# Patient Record
Sex: Male | Born: 1965 | Race: White | Hispanic: No | Marital: Married | Smoking: Never smoker
Health system: Southern US, Community
[De-identification: ages and names within clinical notes are randomized; demographics above are authoritative.]

## PROBLEM LIST (undated history)

## (undated) DIAGNOSIS — T7840XA Allergy, unspecified, initial encounter: Secondary | ICD-10-CM

## (undated) DIAGNOSIS — D126 Benign neoplasm of colon, unspecified: Secondary | ICD-10-CM

## (undated) DIAGNOSIS — H3530 Unspecified macular degeneration: Secondary | ICD-10-CM

## (undated) DIAGNOSIS — R51 Headache: Secondary | ICD-10-CM

## (undated) DIAGNOSIS — M1990 Unspecified osteoarthritis, unspecified site: Secondary | ICD-10-CM

## (undated) HISTORY — DX: Unspecified osteoarthritis, unspecified site: M19.90

## (undated) HISTORY — DX: Allergy, unspecified, initial encounter: T78.40XA

## (undated) HISTORY — DX: Unspecified macular degeneration: H35.30

## (undated) HISTORY — DX: Benign neoplasm of colon, unspecified: D12.6

## (undated) HISTORY — PX: PARTIAL KNEE ARTHROPLASTY: SHX2174

## (undated) HISTORY — PX: COLONOSCOPY: SHX174

## (undated) HISTORY — PX: POLYPECTOMY: SHX149

## (undated) HISTORY — DX: Headache: R51

---
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
## 1990-09-05 HISTORY — PX: OTHER SURGICAL HISTORY: SHX169

## 1998-07-21 ENCOUNTER — Emergency Department (HOSPITAL_COMMUNITY): Admission: EM | Admit: 1998-07-21 | Discharge: 1998-07-21 | Payer: Self-pay | Admitting: Emergency Medicine

## 2006-05-21 ENCOUNTER — Emergency Department (HOSPITAL_COMMUNITY): Admission: EM | Admit: 2006-05-21 | Discharge: 2006-05-21 | Payer: Self-pay | Admitting: Emergency Medicine

## 2010-01-25 ENCOUNTER — Ambulatory Visit: Payer: Self-pay | Admitting: Family Medicine

## 2010-01-25 DIAGNOSIS — K4090 Unilateral inguinal hernia, without obstruction or gangrene, not specified as recurrent: Secondary | ICD-10-CM | POA: Insufficient documentation

## 2010-01-25 DIAGNOSIS — M1990 Unspecified osteoarthritis, unspecified site: Secondary | ICD-10-CM | POA: Insufficient documentation

## 2010-01-25 DIAGNOSIS — Z87898 Personal history of other specified conditions: Secondary | ICD-10-CM

## 2010-01-25 DIAGNOSIS — N529 Male erectile dysfunction, unspecified: Secondary | ICD-10-CM | POA: Insufficient documentation

## 2010-01-29 ENCOUNTER — Ambulatory Visit: Payer: Self-pay | Admitting: Family Medicine

## 2010-01-29 LAB — CONVERTED CEMR LAB
ALT: 27 units/L (ref 0–53)
AST: 38 units/L — ABNORMAL HIGH (ref 0–37)
BUN: 18 mg/dL (ref 6–23)
Bilirubin Urine: NEGATIVE
Bilirubin, Direct: 0.2 mg/dL (ref 0.0–0.3)
Cholesterol: 174 mg/dL (ref 0–200)
Creatinine, Ser: 1.1 mg/dL (ref 0.4–1.5)
Eosinophils Relative: 2.5 % (ref 0.0–5.0)
GFR calc non Af Amer: 74.13 mL/min (ref >60)
HCT: 43.5 % (ref 39.0–52.0)
LDL Cholesterol: 108 mg/dL — ABNORMAL HIGH (ref 0–99)
Monocytes Relative: 7.4 % (ref 3.0–12.0)
Neutrophils Relative %: 67.6 % (ref 43.0–77.0)
Nitrite: NEGATIVE
Platelets: 204 10*3/uL (ref 150.0–400.0)
Potassium: 4.2 meq/L (ref 3.5–5.1)
RBC: 4.93 M/uL (ref 4.22–5.81)
Total Bilirubin: 1 mg/dL (ref 0.3–1.2)
Total CHOL/HDL Ratio: 4
Urobilinogen, UA: 0.2
VLDL: 27 mg/dL (ref 0.0–40.0)
WBC: 6.3 10*3/uL (ref 4.5–10.5)

## 2010-02-08 ENCOUNTER — Ambulatory Visit: Payer: Self-pay | Admitting: Family Medicine

## 2010-09-05 HISTORY — PX: KNEE ARTHROSCOPY: SUR90

## 2010-10-07 NOTE — Assessment & Plan Note (Signed)
Summary: cpx/njr   Vital Signs:  Patient profile:   45 year old male Height:      225 inches Weight:      70.50 pounds Temp:     98.2 degrees F oral Pulse rate:   72 / minute Pulse rhythm:   regular Resp:     12 per minute BP sitting:   110 / 72  (left arm) Cuff size:   regular  Vitals Entered By: Sid Falcon LPN (February 09, 4539 10:42 AM) CC: CPX   History of Present Illness: Here for CPE.  No signif chronic medical problems. Ongoing knee problems and is scheduled to see orthopedist. No regular exercise.  PMH, SH, AND FH reviewed.  Tetanus up to date.  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    Physical Exam  General:  Well-developed,well-nourished,in no acute distress; alert,appropriate and cooperative throughout examination Head:  Normocephalic and atraumatic without obvious  abnormalities. No apparent alopecia or balding. Eyes:  No corneal or conjunctival inflammation noted. EOMI. Perrla. Funduscopic exam benign, without hemorrhages, exudates or papilledema. Vision grossly normal. Ears:  External ear exam shows no significant lesions or deformities.  Otoscopic examination reveals clear canals, tympanic membranes are intact bilaterally without bulging, retraction, inflammation or discharge. Hearing is grossly normal bilaterally. Mouth:  Oral mucosa and oropharynx without lesions or exudates.  Teeth in good repair. Neck:  No deformities, masses, or tenderness noted. Lungs:  Normal respiratory effort, chest expands symmetrically. Lungs are clear to auscultation, no crackles or wheezes. Heart:  Normal rate and regular rhythm. S1 and S2 normal without gallop, murmur, click, rub or other extra sounds. Abdomen:  Bowel sounds positive,abdomen soft and non-tender without masses, organomegaly or hernias noted. Rectal:  No external abnormalities noted. Normal sphincter tone. No rectal masses or tenderness. Genitalia:  Testes bilaterally descended without nodularity, tenderness or masses. No scrotal masses or lesions. No penis lesions or urethral discharge. Prostate:  Prostate gland firm and smooth, no enlargement, nodularity, tenderness, mass, asymmetry or induration. Msk:  No deformity or scoliosis noted of thoracic or lumbar spine.   Extremities:  No clubbing, cyanosis, edema, or deformity noted with normal full range of motion of all joints.   Neurologic:  No cranial nerve deficits noted. Station and gait are normal. Plantar reflexes are down-going bilaterally. DTRs are symmetrical throughout. Sensory, motor and coordinative functions appear intact. Skin:  multiple nevi but benign features. Cervical Nodes:  No lymphadenopathy noted Psych:  normally interactive, good eye contact, not anxious appearing, and not depressed appearing.     Impression & Recommendations:  Problem #  1:  ROUTINE GENERAL MEDICAL [email or web address]@[email or web address]  CARE FACL (ICD-V70.0) labs reviewed with pt.  Establish more regular exercise.    Complete Medication List: 1)  Cialis 20 Mg Tabs (Tadalafil) .... One by mouth every other day as needed  Patient Instructions: 1)  It is important that you exercise reguarly at least 20 minutes 5 times a week. If you develop chest pain, have severe difficulty breathing, or feel very tired, stop exercising immediately and seek medical attention.  2)  Please schedule a follow-up appointment in 1 year.

## 2010-10-07 NOTE — Assessment & Plan Note (Signed)
Summary: to be est at brass/?hernia/njr   Vital Signs:  Patient profile:   45 year old male Height:      224 inches Weight:      70.50 pounds BMI:     0.99 Temp:     98.4 degrees F oral Pulse rate:   72 / minute Pulse rhythm:   regular Resp:     12 per minute BP sitting:   100 / 62  (left arm) Cuff size:   regular  Vitals Entered By: Sid Falcon LPN (Jan 25, 2010 11:15 AM) CC: New to establish, right lower extremity discomfort   History of Present Illness: Patient new to establish care. Past medical history of migraine headaches. Usually over-the-counter medications relieve these. No progressive pattern.  Intermittent right scrotal swelling. Nonpainful. Possibly worse with straining. No known history of hernia.  History of chronic right knee pain greater than left. Known osteoarthritis. Previously seen orthopedist. Pain with squatting. Intermittent swelling. No locking or giving way.  History erectile dysfunction. Prescription for Cialis but never filled. Nonsmoker. No signif risk factors for peripheral vascular disease.  Family history and social history are as recorded  Preventive Screening-Counseling & Management  Alcohol-Tobacco     Smoking Status: never  Allergies (verified): 1)  Penicillin V Potassium (Penicillin V Potassium)  Past History:  Family History: Last updated: 01/25/2010 Family History of Arthritis Family History Hypertension, mother  Social History: Last updated: 01/25/2010 Occupation: Married Never Smoked Alcohol use-yes  Risk Factors: Smoking Status: never (01/25/2010)  Past Medical History: Migraines Osteoarthritis  Past Surgical History: Tonsillectomy Bone graft 1992 L hand  Family History: Family History of Arthritis Family History Hypertension, mother  Social History: Occupation: Married Never Smoked Alcohol use-yes Occupation:  employed Smoking Status:  never  Review of Systems  The patient denies anorexia,  fever, weight loss, weight gain, chest pain, syncope, dyspnea on exertion, peripheral edema, prolonged cough, hemoptysis, abdominal pain, melena, hematochezia, severe indigestion/heartburn, muscle weakness, and suspicious skin lesions.    Physical Exam  General:  Well-developed,well-nourished,in no acute distress; alert,appropriate and cooperative throughout examination Ears:  External ear exam shows no significant lesions or deformities.  Otoscopic examination reveals clear canals, tympanic membranes are intact bilaterally without bulging, retraction, inflammation or discharge. Hearing is grossly normal bilaterally. Mouth:  Oral mucosa and oropharynx without lesions or exudates.  Teeth in good repair. Neck:  No deformities, masses, or tenderness noted. Lungs:  Normal respiratory effort, chest expands symmetrically. Lungs are clear to auscultation, no crackles or wheezes. Heart:  Normal rate and regular rhythm. S1 and S2 normal without gallop, murmur, click, rub or other extra sounds. Genitalia:  ?small R inguinal hernia nontender.  No testicular mass.  Extremities:  No clubbing, cyanosis, edema, or deformity noted with normal full range of motion of all joints.   Psych:  normally interactive, not anxious appearing, and not depressed appearing.     Impression & Recommendations:  Problem # 1:  OSTEOARTHRITIS (ICD-715.90) mostly knees.  Problem # 2:  IMPOTENCE OF ORGANIC ORIGIN (ICD-607.84) rx for Cialis. His updated medication list for this problem includes:    Cialis 20 Mg Tabs (Tadalafil) ..... One by mouth every other day as needed  Problem # 3:  INGUINAL HERNIA (ICD-550.90) Assessment: New pt scheduling CPE and reassess then.  Problem # 4:  MIGRAINES, HX OF (ICD-V13.8)  Complete Medication List: 1)  Cialis 20 Mg Tabs (Tadalafil) .... One by mouth every other day as needed  Patient Instructions: 1)  schedule complete physical  examination Prescriptions: CIALIS 20 MG TABS  (TADALAFIL) one by mouth every other day as needed  #6 x 11   Entered and Authorized by:   Evelena Peat MD   Signed by:   Evelena Peat MD on 01/25/2010   Method used:   Print then Give to Patient   RxID:   [id number]   Preventive Care Screening  Last Tetanus Booster:    Date:  11/04/2007    Results:  Historical

## 2010-10-24 ENCOUNTER — Ambulatory Visit (INDEPENDENT_AMBULATORY_CARE_PROVIDER_SITE_OTHER): Payer: BC Managed Care – PPO

## 2010-10-24 ENCOUNTER — Emergency Department (HOSPITAL_COMMUNITY)
Admission: EM | Admit: 2010-10-24 | Discharge: 2010-10-24 | Disposition: A | Discharge status: Home / Self Care | Payer: BC Managed Care – PPO | Attending: Emergency Medicine | Admitting: Emergency Medicine

## 2010-10-24 ENCOUNTER — Inpatient Hospital Stay (INDEPENDENT_AMBULATORY_CARE_PROVIDER_SITE_OTHER)
Admission: RE | Admit: 2010-10-24 | Discharge: 2010-10-24 | Disposition: A | Discharge status: Home / Self Care | Payer: BC Managed Care – PPO | Source: Ambulatory Visit | Attending: Emergency Medicine | Admitting: Emergency Medicine

## 2010-10-24 DIAGNOSIS — R112 Nausea with vomiting, unspecified: Secondary | ICD-10-CM | POA: Insufficient documentation

## 2010-10-24 DIAGNOSIS — R109 Unspecified abdominal pain: Secondary | ICD-10-CM | POA: Insufficient documentation

## 2010-10-24 DIAGNOSIS — K5289 Other specified noninfective gastroenteritis and colitis: Secondary | ICD-10-CM | POA: Insufficient documentation

## 2010-10-24 DIAGNOSIS — R51 Headache: Secondary | ICD-10-CM | POA: Insufficient documentation

## 2010-10-24 DIAGNOSIS — R197 Diarrhea, unspecified: Secondary | ICD-10-CM | POA: Insufficient documentation

## 2010-10-24 DIAGNOSIS — K921 Melena: Secondary | ICD-10-CM | POA: Insufficient documentation

## 2010-10-24 LAB — POCT I-STAT, CHEM 8
BUN: 24 mg/dL — ABNORMAL HIGH (ref 6–23)
Calcium, Ion: 1.09 mmol/L — ABNORMAL LOW (ref 1.12–1.32)
Creatinine, Ser: 1.3 mg/dL (ref 0.4–1.5)
Glucose, Bld: 121 mg/dL — ABNORMAL HIGH (ref 70–99)
TCO2: 23 mmol/L (ref 0–100)

## 2010-10-24 LAB — CBC
Hemoglobin: 17.6 g/dL — ABNORMAL HIGH (ref 13.0–17.0)
MCH: 30.6 pg (ref 26.0–34.0)
MCHC: 35.6 g/dL (ref 30.0–36.0)
Platelets: 189 10*3/uL (ref 150–400)

## 2010-10-24 LAB — DIFFERENTIAL
Basophils Relative: 0 % (ref 0–1)
Eosinophils Absolute: 0 10*3/uL (ref 0.0–0.7)
Monocytes Absolute: 0.5 10*3/uL (ref 0.1–1.0)
Monocytes Relative: 3 % (ref 3–12)

## 2010-10-24 LAB — URINALYSIS, ROUTINE W REFLEX MICROSCOPIC
Ketones, ur: NEGATIVE mg/dL
Leukocytes, UA: NEGATIVE
Protein, ur: NEGATIVE mg/dL
Urine Glucose, Fasting: 250 mg/dL — AB

## 2010-10-25 ENCOUNTER — Ambulatory Visit (INDEPENDENT_AMBULATORY_CARE_PROVIDER_SITE_OTHER): Payer: BC Managed Care – PPO | Admitting: Family Medicine

## 2010-10-25 ENCOUNTER — Encounter: Payer: Self-pay | Admitting: Family Medicine

## 2010-10-25 VITALS — BP 102/68 | Temp 98.1°F | Ht 71.0 in | Wt 232.0 lb

## 2010-10-25 DIAGNOSIS — K529 Noninfective gastroenteritis and colitis, unspecified: Secondary | ICD-10-CM

## 2010-10-25 DIAGNOSIS — K5289 Other specified noninfective gastroenteritis and colitis: Secondary | ICD-10-CM

## 2010-10-25 NOTE — Progress Notes (Signed)
  Subjective:    Patient ID: Martin Bennett, male    DOB: December 19, 1965, 45 y.o.   MRN: [id number]  HPI  Patient seen for ER followup. Presented there yesterday with vomiting and diarrhea. Symptoms started around 5 AM. Presented to the emergency department around 6 PM. Nonbloody diarrhea and no hematemesis. Patient was given IV fluids and Zofran which helped control nausea. Feels much better today with no further diarrhea or vomiting. No fever. Crampy abdominal pain yesterday which has resolved. One daughter and wife with similar symptoms. Has kept down several fluids today.   Review of Systems  poor appetite. No fever. No abdominal pain today.    Objective:   Physical Exam     Patient is alert nontoxic in appearance Oropharynx is moist and clear Neck is supple with no adenopathy Chest clear to auscultation Heart regular rhythm and rate Abdomen is nondistended , normal bowel sounds, soft and nontender with no mass    Assessment & Plan:   viral gastroenteritis -resolving. Reviewed bland diet. Work note written for today

## 2010-12-31 ENCOUNTER — Ambulatory Visit (INDEPENDENT_AMBULATORY_CARE_PROVIDER_SITE_OTHER): Payer: BC Managed Care – PPO | Admitting: Family Medicine

## 2010-12-31 ENCOUNTER — Encounter: Payer: Self-pay | Admitting: Family Medicine

## 2010-12-31 VITALS — BP 110/68 | Temp 98.0°F | Wt 227.0 lb

## 2010-12-31 DIAGNOSIS — K921 Melena: Secondary | ICD-10-CM

## 2010-12-31 NOTE — Patient Instructions (Signed)
Avoid aspirin. Continue plenty of fluids and high fiber diet. Stool softeners as needed to avoid constipation

## 2010-12-31 NOTE — Progress Notes (Signed)
  Subjective:    Patient ID: Martin Bennett, male    DOB: 12/15/1965, 45 y.o.   MRN: [id number]  HPI Patient seen with approximately one-week history of intermittent bright red blood per rectum. History of similar symptoms in the past. Previous diagnosis internal hemorrhoids. Denies recent diarrhea or constipation. No fever. No abdominal pain. Does not take any aspirin or other anticoagulants. No recent appetite or weight changes.  Symptoms do not occur consistently with all stools.  No perianal pain.  Past Medical History  Diagnosis Date  . Headache   . Arthritis     osetoarthritis    Past Surgical History  Procedure Date  . Tonsillectomy   . Bone graft l hand 1992    reports that he has never smoked. He does not have any smokeless tobacco history on file. He reports that he drinks alcohol. He reports that he does not use illicit drugs. family history includes Arthritis in his other and Hypertension in his mother. Allergies  Allergen Reactions  . Penicillins       Review of Systems  Constitutional: Negative for fever, activity change, appetite change and unexpected weight change.  Respiratory: Negative for shortness of breath.   Cardiovascular: Negative for chest pain.  Gastrointestinal: Positive for blood in stool. Negative for nausea, vomiting, abdominal pain, diarrhea, constipation and abdominal distention.       Objective:   Physical Exam  Constitutional: He appears well-developed and well-nourished.  Cardiovascular: Normal rate, regular rhythm and normal heart sounds.   No murmur heard. Pulmonary/Chest: Effort normal and breath sounds normal. No respiratory distress. He has no wheezes. He has no rales.  Abdominal: Soft. Bowel sounds are normal. He exhibits no distension. There is no tenderness. There is no rebound and no guarding.  Genitourinary: Rectum normal.       No rectal mass. Minimal stool in rectal vault. Hemoccult negative Endoscopy reveals that he does have  some internal hemorrhoids but no active bleeding at this time.   Did not appreciate any anal fissures          Assessment & Plan:  Hematochezia. He does have history of internal hemorrhoids and has some visible internal hemorrhoids but no active bleeding at this time. Given the fact that we cannot localize active source of bleeding and reported bright red blood this morning recommend further evaluation. Check CBC. Refer to gastroenterologist

## 2011-01-01 LAB — CBC WITH DIFFERENTIAL/PLATELET
Eosinophils Absolute: 0.4 10*3/uL (ref 0.0–0.7)
Eosinophils Relative: 5 % (ref 0–5)
Hemoglobin: 14.7 g/dL (ref 13.0–17.0)
Lymphs Abs: 1.8 10*3/uL (ref 0.7–4.0)
MCH: 29.5 pg (ref 26.0–34.0)
MCV: 85.3 fL (ref 78.0–100.0)
Monocytes Absolute: 0.8 10*3/uL (ref 0.1–1.0)
Monocytes Relative: 10 % (ref 3–12)
Platelets: 231 10*3/uL (ref 150–400)
RBC: 4.98 MIL/uL (ref 4.22–5.81)

## 2011-01-06 NOTE — Progress Notes (Signed)
Quick Note:  Pt informed on personally idenfied VM ______

## 2011-02-03 ENCOUNTER — Other Ambulatory Visit (INDEPENDENT_AMBULATORY_CARE_PROVIDER_SITE_OTHER): Payer: BC Managed Care – PPO

## 2011-02-03 DIAGNOSIS — Z0000 Encounter for general adult medical examination without abnormal findings: Secondary | ICD-10-CM

## 2011-02-03 LAB — LIPID PANEL
HDL: 43.9 mg/dL (ref >39.00)
Total CHOL/HDL Ratio: 3
Triglycerides: 103 mg/dL (ref 0.0–149.0)
VLDL: 20.6 mg/dL (ref 0.0–40.0)

## 2011-02-03 LAB — BASIC METABOLIC PANEL
BUN: 20 mg/dL (ref 6–23)
CO2: 28 mEq/L (ref 19–32)
Calcium: 8.9 mg/dL (ref 8.4–10.5)
Creatinine, Ser: 1.1 mg/dL (ref 0.4–1.5)
Glucose, Bld: 79 mg/dL (ref 70–99)

## 2011-02-03 LAB — CBC WITH DIFFERENTIAL/PLATELET
Basophils Absolute: 0 10*3/uL (ref 0.0–0.1)
Eosinophils Absolute: 0.2 10*3/uL (ref 0.0–0.7)
Lymphocytes Relative: 23.1 % (ref 12.0–46.0)
MCHC: 34.1 g/dL (ref 30.0–36.0)
Monocytes Absolute: 0.5 10*3/uL (ref 0.1–1.0)
Neutrophils Relative %: 64.8 % (ref 43.0–77.0)
RDW: 13.3 % (ref 11.5–14.6)

## 2011-02-03 LAB — POCT URINALYSIS DIPSTICK
Blood, UA: NEGATIVE
Ketones, UA: NEGATIVE
Leukocytes, UA: NEGATIVE
Protein, UA: NEGATIVE
pH, UA: 6

## 2011-02-03 LAB — HEPATIC FUNCTION PANEL
Albumin: 4 g/dL (ref 3.5–5.2)
Alkaline Phosphatase: 68 U/L (ref 39–117)
Bilirubin, Direct: 0.1 mg/dL (ref 0.0–0.3)

## 2011-02-10 ENCOUNTER — Encounter: Payer: Self-pay | Admitting: Family Medicine

## 2011-02-10 ENCOUNTER — Ambulatory Visit (INDEPENDENT_AMBULATORY_CARE_PROVIDER_SITE_OTHER): Payer: BC Managed Care – PPO | Admitting: Family Medicine

## 2011-02-10 VITALS — BP 110/70 | Temp 98.2°F | Ht 71.0 in | Wt 228.0 lb

## 2011-02-10 DIAGNOSIS — Z0000 Encounter for general adult medical examination without abnormal findings: Secondary | ICD-10-CM

## 2011-02-10 MED ORDER — TADALAFIL 20 MG PO TABS: ORAL_TABLET | ORAL | Status: DC

## 2011-02-10 NOTE — Progress Notes (Signed)
  Subjective:    Patient ID: Martin Bennett, male    DOB: Jul 17, 1966, 45 y.o.   MRN: [id number]  HPI Patient here for complete physical examination. Recent bright red blood per rectum is fully clear. He has history of erectile dysfunction and takes Cialis but no other prescription medications. Tetanus 2009. Patient had recent left knee arthroscopic surgery with great improvement in pain. More active since then. He is exercising more. No history of smoking.  Family history is reviewed no significant changes   Review of Systems  Constitutional: Negative for fever, activity change, appetite change and fatigue.  HENT: Negative for ear pain, congestion and trouble swallowing.   Eyes: Negative for pain and visual disturbance.  Respiratory: Negative for cough, shortness of breath and wheezing.   Cardiovascular: Negative for chest pain and palpitations.  Gastrointestinal: Negative for nausea, vomiting, abdominal pain, diarrhea, constipation, blood in stool, abdominal distention and rectal pain.  Genitourinary: Negative for dysuria, hematuria and testicular pain.  Musculoskeletal: Negative for joint swelling and arthralgias.  Skin: Negative for rash.  Neurological: Negative for dizziness, syncope and headaches.  Hematological: Negative for adenopathy.  Psychiatric/Behavioral: Negative for confusion and dysphoric mood.       Objective:   Physical Exam  Constitutional: He is oriented to person, place, and time. He appears well-developed and well-nourished. No distress.  HENT:  Head: Normocephalic and atraumatic.  Right Ear: External ear normal.  Left Ear: External ear normal.  Mouth/Throat: Oropharynx is clear and moist.  Eyes: Conjunctivae and EOM are normal. Pupils are equal, round, and reactive to light.  Neck: Normal range of motion. Neck supple. No thyromegaly present.  Cardiovascular: Normal rate, regular rhythm and normal heart sounds.   No murmur heard. Pulmonary/Chest: No  respiratory distress. He has no wheezes. He has no rales.  Abdominal: Soft. Bowel sounds are normal. He exhibits no distension and no mass. There is no tenderness. There is no rebound and no guarding.  Genitourinary:       Rectal exam done just couple months ago so deferred today  Musculoskeletal: He exhibits no edema.  Lymphadenopathy:    He has no cervical adenopathy.  Neurological: He is alert and oriented to person, place, and time. He displays normal reflexes. No cranial nerve deficit.  Skin: No rash noted.       Patient has multiple scattered nevi on trunk but none with significant atypical features  Psychiatric: He has a normal mood and affect.          Assessment & Plan:  Complete physical examination. Labs reviewed with patient and all favorable. Tetanus up to date. Continue regular exercise. Followup promptly for any recurrent bloody stools

## 2011-02-10 NOTE — Patient Instructions (Signed)
Continue regular exercise.  Notify me if you have any recurrent bloody stools.

## 2011-09-06 HISTORY — PX: KNEE ARTHROSCOPY: SUR90

## 2011-10-17 ENCOUNTER — Telehealth: Payer: Self-pay | Admitting: *Deleted

## 2011-10-17 NOTE — Telephone Encounter (Signed)
Should be seen tomorrow and sooner (ED) if any dizziness or abdominal pain or profuse bleeding.

## 2011-10-17 NOTE — Telephone Encounter (Signed)
Pt has had 3 episodes of bleeding with BMs and today spontaneous bleeding without BM.  Saturated his undershorts.  What are Dr. Lucie Leather recommendations.?

## 2011-10-18 ENCOUNTER — Encounter: Payer: Self-pay | Admitting: Family Medicine

## 2011-10-18 ENCOUNTER — Ambulatory Visit (INDEPENDENT_AMBULATORY_CARE_PROVIDER_SITE_OTHER): Payer: BC Managed Care – PPO | Admitting: Family Medicine

## 2011-10-18 VITALS — BP 100/50 | Temp 98.3°F | Wt 231.0 lb

## 2011-10-18 DIAGNOSIS — K602 Anal fissure, unspecified: Secondary | ICD-10-CM

## 2011-10-18 DIAGNOSIS — K921 Melena: Secondary | ICD-10-CM

## 2011-10-18 NOTE — Patient Instructions (Signed)
Anal Fissure, Adult An anal fissure is a small tear or crack in the skin around the anus. Bleeding from a fissure usually stops on its own within a few minutes. However, bleeding will often reoccur with each bowel movement until the crack heals.  CAUSES   Passing large, hard stools.   Frequent diarrheal stools.   Constipation.   Inflammatory bowel disease (Crohn's disease or ulcerative colitis).   Infections.   Anal sex.  SYMPTOMS   Small amounts of blood seen on your stools, on toilet paper, or in the toilet after a bowel movement.   Rectal bleeding.   Painful bowel movements.   Itching or irritation around the anus.  DIAGNOSIS Your caregiver will examine the anal area. An anal fissure can usually be seen with careful inspection. A rectal exam may be performed and a short tube (anoscope) may be used to examine the anal canal. TREATMENT   You may be instructed to take fiber supplements. These supplements can soften your stool to help make bowel movements easier.   Sitz baths may be recommended to help heal the tear. Do not use soap in the sitz baths.   A medicated cream or ointment may be prescribed to lessen discomfort.  HOME CARE INSTRUCTIONS   Maintain a diet high in fruits, whole grains, and vegetables. Avoid constipating foods like bananas and dairy products.   Take sitz baths as directed by your caregiver.   Drink enough fluids to keep your urine clear or pale yellow.   Only take over-the-counter or prescription medicines for pain, discomfort, or fever as directed by your caregiver. Do not take aspirin as this may increase bleeding.   Do not use ointments containing numbing medications (anesthetics) or hydrocortisone. They could slow healing.  SEEK MEDICAL CARE IF:   Your fissure is not completely healed within 3 days.   You have further bleeding.   You have a fever.   You have diarrhea mixed with blood.   You have pain.   Your problem is getting worse  rather than better.  MAKE SURE YOU:   Understand these instructions.   Will watch your condition.   Will get help right away if you are not doing well or get worse.  Document Released: 08/22/2005 Document Revised: 05/04/2011 Document Reviewed: 02/06/2011 [employer] Patient Information 2012 [employer]. 

## 2011-10-18 NOTE — Progress Notes (Signed)
  Subjective:    Patient ID: Martin Bennett, male    DOB: Jan 31, 1966, 46 y.o.   MRN: [id number]  HPI  Acute visit. Bright red blood per rectum. 3-4 episodes this week. No pain with stools. No significant constipation. Had similar episodes remotely in the past attributed to internal hemorrhoids. He denied any dizziness. No family history of colorectal problems. No recent diarrhea. Denies abdominal pain. No appetite or weight changes. No fever or chills.  Similar episode last year about this time. We could not find clear source then. CBC normal. We referred for gastroenterology evaluation but patient never went.  Past Medical History  Diagnosis Date  . Headache   . Arthritis     osetoarthritis    Past Surgical History  Procedure Date  . Tonsillectomy   . Bone graft l hand 1992  . Knee arthroscopy 2012    L knee    reports that he has never smoked. He does not have any smokeless tobacco history on file. He reports that he drinks alcohol. He reports that he does not use illicit drugs. family history includes Arthritis in his other and Hypertension in his mother. Allergies  Allergen Reactions  . Penicillins       Review of Systems  Constitutional: Negative for fever, chills, appetite change and unexpected weight change.  Respiratory: Negative for shortness of breath.   Cardiovascular: Negative for chest pain.  Gastrointestinal: Positive for blood in stool. Negative for nausea, vomiting, abdominal pain, diarrhea, constipation and abdominal distention.  Neurological: Negative for dizziness.       Objective:   Physical Exam  Constitutional: He appears well-developed and well-nourished.  Cardiovascular: Normal rate and regular rhythm.   Pulmonary/Chest: Effort normal and breath sounds normal. No respiratory distress. He has no wheezes. He has no rales.  Abdominal: Soft. Bowel sounds are normal. He exhibits no distension and no mass. There is no tenderness. There is no rebound and  no guarding.  Genitourinary:       Small fissure 12 O'Clock.  No active bleeding.  Anoscopy-internal hemorrhoids but no active bleeding.  No other mass.          Assessment & Plan:  Hematochezia with bright blood probably secondary to anal fissure.  Instruction given.  Measures to reduce constipation.  Sitz baths.  Touch base 2 weeks if persistent bleeding.

## 2011-10-18 NOTE — Telephone Encounter (Signed)
Pt notified and appt. Scheduled tomorrow.

## 2011-11-09 ENCOUNTER — Telehealth: Payer: Self-pay | Admitting: Family Medicine

## 2011-11-09 DIAGNOSIS — K921 Melena: Secondary | ICD-10-CM

## 2011-11-09 NOTE — Telephone Encounter (Signed)
Patient called requesting the MD to call him back. Patient chose not to elaborate only stating that it is concerning his last office visit. Please assist.

## 2011-11-10 NOTE — Telephone Encounter (Signed)
Spoke with patient. Continued bright red blood per rectum. Even though small anal fissure recently not clear this is source of bleeding. He has had some bright red blood intermittently for one year. We had previously recommended colonoscopy or further screening with GI one year ago but he never went. He is willing to consider this time. He has not any concerning features such as appetite or weight changes. No change in stool caliber.

## 2011-11-11 ENCOUNTER — Encounter: Payer: Self-pay | Admitting: Gastroenterology

## 2011-12-12 ENCOUNTER — Other Ambulatory Visit (INDEPENDENT_AMBULATORY_CARE_PROVIDER_SITE_OTHER): Payer: BC Managed Care – PPO

## 2011-12-12 ENCOUNTER — Encounter: Payer: Self-pay | Admitting: Gastroenterology

## 2011-12-12 ENCOUNTER — Ambulatory Visit (INDEPENDENT_AMBULATORY_CARE_PROVIDER_SITE_OTHER): Payer: BC Managed Care – PPO | Admitting: Gastroenterology

## 2011-12-12 VITALS — BP 118/64 | HR 80 | Ht 71.0 in | Wt 226.0 lb

## 2011-12-12 DIAGNOSIS — K649 Unspecified hemorrhoids: Secondary | ICD-10-CM

## 2011-12-12 DIAGNOSIS — K625 Hemorrhage of anus and rectum: Secondary | ICD-10-CM

## 2011-12-12 LAB — CBC WITH DIFFERENTIAL/PLATELET
Basophils Absolute: 0 10*3/uL (ref 0.0–0.1)
Basophils Relative: 0.6 % (ref 0.0–3.0)
Eosinophils Absolute: 0.3 10*3/uL (ref 0.0–0.7)
HCT: 42 % (ref 39.0–52.0)
Hemoglobin: 14.2 g/dL (ref 13.0–17.0)
Lymphocytes Relative: 20.1 % (ref 12.0–46.0)
Lymphs Abs: 1.3 10*3/uL (ref 0.7–4.0)
MCHC: 33.8 g/dL (ref 30.0–36.0)
MCV: 88.1 fl (ref 78.0–100.0)
Monocytes Absolute: 0.7 10*3/uL (ref 0.1–1.0)
Neutro Abs: 4.1 10*3/uL (ref 1.4–7.7)
RBC: 4.77 Mil/uL (ref 4.22–5.81)
RDW: 12.7 % (ref 11.5–14.6)

## 2011-12-12 MED ORDER — MOVIPREP 100 G PO SOLR: 1.0000 | ORAL | Status: DC

## 2011-12-12 NOTE — Patient Instructions (Signed)
You will be set up for a colonoscopy. You will have labs checked today in the basement lab.  Please head down after you check out with the front desk  (cbc) 

## 2011-12-12 NOTE — Progress Notes (Signed)
HPI: This is a  is a 46 year old man whom I am meeting for the first time today  Has had intermittent red rectal bleeding (2 weeks to 2 months).  It has stopped a month ago.  Dripping bright red blood.  NO associated anal discomforts.  HE has no trouble with BMs daily, no straining.   PCP evaluation, he declined topical ointmets for small anal fissure, has not done sitz baths.  Has 46 yo and 108 yo daughter.  CBC about a year ago was normal  Review of systems: Pertinent positive and negative review of systems were noted in the above HPI section. Complete review of systems was performed and was otherwise normal.    Past Medical History  Diagnosis Date  . Headache   . Arthritis     osetoarthritis   . Hemorrhoids     Past Surgical History  Procedure Date  . Tonsillectomy 1974  . Bone graft l hand 1992  . Knee arthroscopy 2012    L knee    Current Outpatient Prescriptions  Medication Sig Dispense Refill  . tadalafil (CIALIS) 20 MG tablet One by mouth every other day as needed  10 tablet  11    Allergies as of 12/12/2011 - Review Complete 12/12/2011  Allergen Reaction Noted  . Penicillins  01/25/2010    Family History  Problem Relation Age of Onset  . Hypertension Mother   . Arthritis Other   . Colon cancer Neg Hx   . Breast cancer Mother     History   Social History  . Marital Status: Married    Spouse Name: N/A    Number of Children: 2  . Years of Education: N/A   Occupational History  . [employer]    Social History Main Topics  . Smoking status: Never Smoker   . Smokeless tobacco: Never Used  . Alcohol Use: Yes     occ  . Drug Use: No  . Sexually Active: Not on file   Other Topics Concern  . Not on file   Social History Narrative  . No narrative on file       Physical Exam: BP 118/64  Pulse 80  Ht 5\' 11"  (1.803 m)  Wt 226 lb (102.513 kg)  BMI 31.52 kg/m2 Constitutional: generally well-appearing Psychiatric: alert and oriented x3 Eyes:  extraocular movements intact Mouth: oral pharynx moist, no lesions Neck: supple no lymphadenopathy Cardiovascular: heart regular rate and rhythm Lungs: clear to auscultation bilaterally Abdomen: soft, nontender, nondistended, no obvious ascites, no peritoneal signs, normal bowel sounds Extremities: no lower extremity edema bilaterally Skin: no lesions on visible extremities Rectal: Medium-sized, nontender, somewhat swollen external anal hemorrhoid, no clear fissure, no distal rectal masses   Assessment and plan: 46 y.o. male with  rectal bleeding, external hemorrhoid  I suspect his bleeding is indeed from the hemorrhoid disease. Apart from bleeding the hemorrhoid causes him absolutely no symptoms. He does not have to strain at all to move his bowels daily. He'll have a CBC to see if he is anemic. We will proceed with colonoscopy at his service convenience to rule out other potential causes such as neoplasm. I I think that  will be very unlikely.

## 2012-01-16 ENCOUNTER — Other Ambulatory Visit: Payer: Self-pay

## 2012-01-16 ENCOUNTER — Ambulatory Visit (AMBULATORY_SURGERY_CENTER): Payer: BC Managed Care – PPO | Admitting: Gastroenterology

## 2012-01-16 ENCOUNTER — Encounter: Payer: Self-pay | Admitting: Gastroenterology

## 2012-01-16 VITALS — BP 113/69 | HR 59 | Temp 98.2°F | Resp 20 | Ht 71.0 in | Wt 226.0 lb

## 2012-01-16 DIAGNOSIS — D126 Benign neoplasm of colon, unspecified: Secondary | ICD-10-CM

## 2012-01-16 DIAGNOSIS — K625 Hemorrhage of anus and rectum: Secondary | ICD-10-CM

## 2012-01-16 DIAGNOSIS — K649 Unspecified hemorrhoids: Secondary | ICD-10-CM

## 2012-01-16 MED ORDER — SODIUM CHLORIDE 0.9 % IV SOLN: 500.0000 mL | INTRAVENOUS | Status: DC

## 2012-01-16 NOTE — Op Note (Signed)
[facility] [address] [place], [place]  [address]  COLONOSCOPY PROCEDURE REPORT  PATIENT:  Martin Bennett, Martin Bennett  MR#:  [id number] BIRTHDATE:  10-14-1965, 46 yrs. old  GENDER:  male ENDOSCOPIST:  Rachael Fee, MD REF. BY:  Evelena Peat, M.D. PROCEDURE DATE:  01/16/2012 PROCEDURE:  Colonoscopy with snare polypectomy ASA CLASS:  Class II INDICATIONS:  rectal bleeding MEDICATIONS:   Fentanyl 50 mcg IV, These medications were titrated to patient response per physician's verbal order, Versed 5 mg IV  DESCRIPTION OF PROCEDURE:   After the risks benefits and alternatives of the procedure were thoroughly explained, informed consent was obtained.  Digital rectal exam was performed and revealed no rectal masses.   The LB [id number]-[id number] endoscope was introduced through the anus and advanced to the cecum, which was identified by both the appendix and ileocecal valve, without limitations.  The quality of the prep was good..  The instrument was then slowly withdrawn as the colon was fully examined. <<PROCEDUREIMAGES>> FINDINGS:  A diminutive polyp was found in the sigmoid colon. This was removed with cold snare and sent to pathology (jar 1) (see image3).  External hemorrhoids were found. These were medium sized, non thrombosed.  This was otherwise a normal examination of the colon (see image1, image2, and image4).   Retroflexed views in the rectum revealed no abnormalities. COMPLICATIONS:  None  ENDOSCOPIC IMPRESSION: 1) Diminutive polyp in the sigmoid colon; this was removed and sent to pathology 2) Medium sized external hemorrhoids; these are the source of the mild (not anemic) rectal bleeding 3) Otherwise normal examination  RECOMMENDATIONS: 1) If the polyp(s) removed today are proven to be adenomatous (pre-cancerous) polyps, you will need a repeat colonoscopy in 5 years. Otherwise you should continue to follow colorectal cancer screening guidelines for "routine  risk" patients with colonoscopy in 10 years. You will receive a letter within 1-2 weeks with the results of your biopsy as well as final recommendations. Please call my office if you have not received a letter after 3 weeks. 2) My office will arrange referral to general surgery to consider hemorrhoid therapy given the persistent bleeding despite normal bowel movements (no straining, no constipation)  ______________________________ Rachael Fee, MD  n. eSIGNED:   Rachael Fee at 01/16/2012 11:01 AM  Gypsy Balsam, [phone]

## 2012-01-16 NOTE — Progress Notes (Signed)
Patient did not experience any of the following events: a burn prior to discharge; a fall within the facility; wrong site/side/patient/procedure/implant event; or a hospital transfer or hospital admission upon discharge from the facility. (G8907) Patient did not have preoperative order for IV antibiotic SSI prophylaxis. (G8918)  

## 2012-01-16 NOTE — Patient Instructions (Signed)
YOU HAD AN ENDOSCOPIC PROCEDURE TODAY AT THE [facility]: Refer to the procedure report that was given to you for any specific questions about what was found during the examination.  If the procedure report does not answer your questions, please call your gastroenterologist to clarify.  If you requested that your care partner not be given the details of your procedure findings, then the procedure report has been included in a sealed envelope for you to review at your convenience later.  YOU SHOULD EXPECT: Some feelings of bloating in the abdomen. Passage of more gas than usual.  Walking can help get rid of the air that was put into your GI tract during the procedure and reduce the bloating. If you had a lower endoscopy (such as a colonoscopy or flexible sigmoidoscopy) you may notice spotting of blood in your stool or on the toilet paper. If you underwent a bowel prep for your procedure, then you may not have a normal bowel movement for a few days.  DIET: Your first meal following the procedure should be a light meal and then it is ok to progress to your normal diet.  A half-sandwich or bowl of soup is an example of a good first meal.  Heavy or fried foods are harder to digest and may make you feel nauseous or bloated.  Likewise meals heavy in dairy and vegetables can cause extra gas to form and this can also increase the bloating.  Drink plenty of fluids but you should avoid alcoholic beverages for 24 hours.  ACTIVITY: Your care partner should take you home directly after the procedure.  You should plan to take it easy, moving slowly for the rest of the day.  You can resume normal activity the day after the procedure however you should NOT DRIVE or use heavy machinery for 24 hours (because of the sedation medicines used during the test).    SYMPTOMS TO REPORT IMMEDIATELY: A gastroenterologist can be reached at any hour.  During normal business hours, 8:30 AM to 5:00 PM Monday through Friday,  call [phone].  After hours and on weekends, please call the GI answering service at [phone] who will take a message and have the physician on call contact you.   Following lower endoscopy (colonoscopy or flexible sigmoidoscopy):  Excessive amounts of blood in the stool  Significant tenderness or worsening of abdominal pains  Swelling of the abdomen that is new, acute  Fever of 100F or higher    FOLLOW UP: If any biopsies were taken you will be contacted by phone or by letter within the next 1-3 weeks.  Call your gastroenterologist if you have not heard about the biopsies in 3 weeks.  Our staff will call the home number listed on your records the next business day following your procedure to check on you and address any questions or concerns that you may have at that time regarding the information given to you following your procedure. This is a courtesy call and so if there is no answer at the home number and we have not heard from you through the emergency physician on call, we will assume that you have returned to your regular daily activities without incident.  SIGNATURES/CONFIDENTIALITY: You and/or your care partner have signed paperwork which will be entered into your electronic medical record.  These signatures attest to the fact that that the information above on your After Visit Summary has been reviewed and is understood.  Full responsibility of the confidentiality   of this discharge information lies with you and/or your care-partner.    Information on polyps & hemorrhoids & high fiber diet given to you today.

## 2012-01-17 ENCOUNTER — Telehealth: Payer: Self-pay | Admitting: *Deleted

## 2012-01-17 ENCOUNTER — Telehealth: Payer: Self-pay

## 2012-01-17 NOTE — Telephone Encounter (Deleted)
  Follow up Call-  Call back number 01/16/2012  Post procedure Call Back phone  # [phone]  Permission to leave phone message Yes     Patient questions:  Do you have a fever, pain , or abdominal swelling? {yes no:314532} Pain Score  {NUMBERS; 0-10:5044} *  Have you tolerated food without any problems? {yes no:314532}  Have you been able to return to your normal activities? {yes no:314532}  Do you have any questions about your discharge instructions: Diet   {yes no:314532} Medications  {yes no:314532} Follow up visit  {yes no:314532}  Do you have questions or concerns about your Care? {yes no:314532}  Actions: * If pain score is 4 or above: {ACTION; [facility] ENDO PAIN >4:21563::"No action needed, pain <4."}

## 2012-01-17 NOTE — Telephone Encounter (Signed)
Blanca to notify pt  

## 2012-01-17 NOTE — Telephone Encounter (Signed)
Message copied by Donata Duff on Tue Jan 17, 2012  9:09 AM ------      Message from: Marnette Burgess      Created: Tue Jan 17, 2012  8:55 AM       Patient is scheduled to see Dr. Darnell Level on 5/23/113 @ 3:00pm, arrive @ 2:30pm.  If you have any questions please call [phone].            Thank You,      Elane Fritz      ----- Message -----         From: Donata Duff, CMA         Sent: 01/16/2012   1:55 PM           To: Marnette Burgess            Pt needs appt for possible hemorrhoid therapy

## 2012-01-17 NOTE — Telephone Encounter (Signed)
No answer. Message left on identified phone number to call if any questions or concerns. 

## 2012-01-20 ENCOUNTER — Encounter: Payer: Self-pay | Admitting: Gastroenterology

## 2012-01-26 ENCOUNTER — Ambulatory Visit (INDEPENDENT_AMBULATORY_CARE_PROVIDER_SITE_OTHER): Payer: BC Managed Care – PPO | Admitting: Surgery

## 2012-02-06 ENCOUNTER — Other Ambulatory Visit (INDEPENDENT_AMBULATORY_CARE_PROVIDER_SITE_OTHER): Payer: BC Managed Care – PPO

## 2012-02-06 DIAGNOSIS — Z0000 Encounter for general adult medical examination without abnormal findings: Secondary | ICD-10-CM

## 2012-02-06 LAB — CBC WITH DIFFERENTIAL/PLATELET
Basophils Absolute: 0 10*3/uL (ref 0.0–0.1)
Basophils Relative: 0.8 % (ref 0.0–3.0)
Eosinophils Absolute: 0.2 10*3/uL (ref 0.0–0.7)
Hemoglobin: 14.9 g/dL (ref 13.0–17.0)
MCHC: 33.5 g/dL (ref 30.0–36.0)
MCV: 88.5 fl (ref 78.0–100.0)
Monocytes Absolute: 0.5 10*3/uL (ref 0.1–1.0)
Neutro Abs: 3.9 10*3/uL (ref 1.4–7.7)
RBC: 5.03 Mil/uL (ref 4.22–5.81)
RDW: 13.1 % (ref 11.5–14.6)

## 2012-02-06 LAB — BASIC METABOLIC PANEL
CO2: 27 mEq/L (ref 19–32)
Chloride: 105 mEq/L (ref 96–112)
Creatinine, Ser: 0.9 mg/dL (ref 0.4–1.5)
Glucose, Bld: 66 mg/dL — ABNORMAL LOW (ref 70–99)
Sodium: 142 mEq/L (ref 135–145)

## 2012-02-06 LAB — POCT URINALYSIS DIPSTICK
Bilirubin, UA: NEGATIVE
Blood, UA: NEGATIVE
Glucose, UA: NEGATIVE
Ketones, UA: NEGATIVE
Nitrite, UA: NEGATIVE
Spec Grav, UA: 1.02

## 2012-02-06 LAB — TSH: TSH: 2.05 u[IU]/mL (ref 0.35–5.50)

## 2012-02-06 LAB — LIPID PANEL
Cholesterol: 146 mg/dL (ref 0–200)
Triglycerides: 140 mg/dL (ref 0.0–149.0)

## 2012-02-06 LAB — HEPATIC FUNCTION PANEL
Albumin: 4 g/dL (ref 3.5–5.2)
Total Protein: 6.8 g/dL (ref 6.0–8.3)

## 2012-02-13 ENCOUNTER — Ambulatory Visit (INDEPENDENT_AMBULATORY_CARE_PROVIDER_SITE_OTHER): Payer: BC Managed Care – PPO | Admitting: Family Medicine

## 2012-02-13 ENCOUNTER — Encounter: Payer: Self-pay | Admitting: Family Medicine

## 2012-02-13 VITALS — BP 90/52 | HR 72 | Temp 98.1°F | Resp 12 | Ht 71.5 in | Wt 227.0 lb

## 2012-02-13 DIAGNOSIS — Z0000 Encounter for general adult medical examination without abnormal findings: Secondary | ICD-10-CM

## 2012-02-13 MED ORDER — TADALAFIL 20 MG PO TABS: ORAL_TABLET | ORAL | Status: DC

## 2012-02-13 NOTE — Progress Notes (Signed)
  Subjective:    Patient ID: Martin Bennett, male    DOB: 02/05/1966, 46 y.o.   MRN: [id number]  HPI  Here for complete physical. He had recent colonoscopy secondary to recurrent rectal bleeding and this showed internal hemorrhoids. No active symptoms. He had one small adenomatous polyp. Repeat colonoscopy recommended 5 years.  Patient has history of some recurrent knee problems and had recent arthroscopic knee surgery which went well. Is not currently exercising. Tetanus is up to date.  Past Medical History  Diagnosis Date  . Headache   . Arthritis     osetoarthritis   . Hemorrhoids    Past Surgical History  Procedure Date  . Tonsillectomy 1974  . Bone graft l hand 1992  . Knee arthroscopy 2012    L knee    reports that he has never smoked. He has never used smokeless tobacco. He reports that he drinks about .6 ounces of alcohol per week. He reports that he does not use illicit drugs. family history includes Arthritis in his other; Breast cancer in his mother; and Hypertension in his mother.  There is no history of Colon cancer. Allergies  Allergen Reactions  . Penicillins      Review of Systems  Constitutional: Negative for fever, activity change, appetite change and fatigue.  HENT: Negative for ear pain, congestion and trouble swallowing.   Eyes: Negative for pain and visual disturbance.  Respiratory: Negative for cough, shortness of breath and wheezing.   Cardiovascular: Negative for chest pain and palpitations.  Gastrointestinal: Negative for nausea, vomiting, abdominal pain, diarrhea, constipation, blood in stool, abdominal distention and rectal pain.  Genitourinary: Negative for dysuria, hematuria and testicular pain.  Musculoskeletal: Positive for arthralgias (Knees). Negative for joint swelling.  Skin: Negative for rash.  Neurological: Negative for dizziness, syncope and headaches.  Hematological: Negative for adenopathy.  Psychiatric/Behavioral: Negative for  confusion and dysphoric mood.       Objective:   Physical Exam  Constitutional: He is oriented to person, place, and time. He appears well-developed and well-nourished. No distress.  HENT:  Head: Normocephalic and atraumatic.  Right Ear: External ear normal.  Left Ear: External ear normal.  Mouth/Throat: Oropharynx is clear and moist.  Eyes: Conjunctivae and EOM are normal. Pupils are equal, round, and reactive to light.  Neck: Normal range of motion. Neck supple. No thyromegaly present.  Cardiovascular: Normal rate, regular rhythm and normal heart sounds.   No murmur heard. Pulmonary/Chest: No respiratory distress. He has no wheezes. He has no rales.  Abdominal: Soft. Bowel sounds are normal. He exhibits no distension and no mass. There is no tenderness. There is no rebound and no guarding.  Musculoskeletal: He exhibits no edema.  Lymphadenopathy:    He has no cervical adenopathy.  Neurological: He is alert and oriented to person, place, and time. He displays normal reflexes. No cranial nerve deficit.  Skin: No rash noted.       Patient has several scattered nevi on his trunk which appear benign  Psychiatric: He has a normal mood and affect.          Assessment & Plan:  Complete physical. Tetanus up-to-date. Recent colonoscopy as above. Labs reviewed with patient stable. Refill Cialis for one year. Try to establish more consistent exercise

## 2012-03-22 ENCOUNTER — Ambulatory Visit (INDEPENDENT_AMBULATORY_CARE_PROVIDER_SITE_OTHER): Payer: BC Managed Care – PPO | Admitting: Surgery

## 2013-03-14 ENCOUNTER — Other Ambulatory Visit: Payer: Self-pay | Admitting: Family Medicine

## 2013-03-18 ENCOUNTER — Telehealth: Payer: Self-pay | Admitting: Family Medicine

## 2013-03-18 NOTE — Telephone Encounter (Signed)
Left message for patient to return call.

## 2013-03-18 NOTE — Telephone Encounter (Signed)
Pt states w/ his new insurance com, they will pay 100% only if they are done at [facility].  Pls advise

## 2013-03-20 NOTE — Telephone Encounter (Signed)
PT is returning your call, please assist.

## 2013-03-22 ENCOUNTER — Other Ambulatory Visit (INDEPENDENT_AMBULATORY_CARE_PROVIDER_SITE_OTHER): Payer: BC Managed Care – PPO

## 2013-03-22 DIAGNOSIS — Z0000 Encounter for general adult medical examination without abnormal findings: Secondary | ICD-10-CM

## 2013-03-22 LAB — POCT URINALYSIS DIPSTICK
Blood, UA: NEGATIVE
Nitrite, UA: NEGATIVE
Protein, UA: NEGATIVE
Spec Grav, UA: 1.025
Urobilinogen, UA: 0.2

## 2013-03-29 ENCOUNTER — Ambulatory Visit (INDEPENDENT_AMBULATORY_CARE_PROVIDER_SITE_OTHER): Payer: BC Managed Care – PPO | Admitting: Family Medicine

## 2013-03-29 ENCOUNTER — Encounter: Payer: Self-pay | Admitting: Family Medicine

## 2013-03-29 VITALS — BP 106/60 | HR 87 | Temp 98.7°F | Ht 71.0 in | Wt 228.0 lb

## 2013-03-29 DIAGNOSIS — Z0000 Encounter for general adult medical examination without abnormal findings: Secondary | ICD-10-CM

## 2013-03-29 MED ORDER — TADALAFIL 20 MG PO TABS: ORAL_TABLET | ORAL | Status: DC

## 2013-03-29 NOTE — Patient Instructions (Signed)
Try to establish more consistent exercise Try to lose some weight. 

## 2013-03-29 NOTE — Progress Notes (Signed)
  Subjective:    Patient ID: Martin Bennett, male    DOB: October 17, 1965, 47 y.o.   MRN: [id number]  HPI Patient here for complete physical. He has some osteoarthritis involving the knees but no other chronic medical problems. He takes no regular medications. Last tetanus 2009. Colonoscopy last year. Recommended five-year followup. No major complaints. Sometimes has frequent urination but he attributes this to drinking lots of fluids. No obstructive urinary symptoms. Nonsmoker.  Past Medical History  Diagnosis Date  . Headache(784.0)   . Arthritis     osetoarthritis   . Hemorrhoids    Past Surgical History  Procedure Laterality Date  . Tonsillectomy  1974  . Bone graft l hand  1992  . Knee arthroscopy  2012    L knee    reports that he has never smoked. He has never used smokeless tobacco. He reports that he drinks about 0.6 ounces of alcohol per week. He reports that he does not use illicit drugs. family history includes Arthritis in his other; Breast cancer in his mother; and Hypertension in his mother.  There is no history of Colon cancer. Allergies  Allergen Reactions  . Penicillins       Review of Systems  Constitutional: Negative for fever, activity change, appetite change and fatigue.  HENT: Negative for ear pain, congestion and trouble swallowing.   Eyes: Negative for pain and visual disturbance.  Respiratory: Negative for cough, shortness of breath and wheezing.   Cardiovascular: Negative for chest pain and palpitations.  Gastrointestinal: Negative for nausea, vomiting, abdominal pain, diarrhea, constipation, blood in stool, abdominal distention and rectal pain.  Endocrine: Negative for polydipsia and polyphagia.  Genitourinary: Positive for frequency. Negative for dysuria, hematuria and testicular pain.  Musculoskeletal: Negative for joint swelling and arthralgias.  Skin: Negative for rash.  Neurological: Negative for dizziness, syncope and headaches.   Hematological: Negative for adenopathy.  Psychiatric/Behavioral: Negative for confusion and dysphoric mood.       Objective:   Physical Exam  Constitutional: He is oriented to person, place, and time. He appears well-developed and well-nourished. No distress.  HENT:  Head: Normocephalic and atraumatic.  Right Ear: External ear normal.  Left Ear: External ear normal.  Mouth/Throat: Oropharynx is clear and moist.  Eyes: Conjunctivae and EOM are normal. Pupils are equal, round, and reactive to light.  Neck: Normal range of motion. Neck supple. No thyromegaly present.  Cardiovascular: Normal rate, regular rhythm and normal heart sounds.   No murmur heard. Pulmonary/Chest: No respiratory distress. He has no wheezes. He has no rales.  Abdominal: Soft. Bowel sounds are normal. He exhibits no distension and no mass. There is no tenderness. There is no rebound and no guarding.  Musculoskeletal: He exhibits no edema.  Lymphadenopathy:    He has no cervical adenopathy.  Neurological: He is alert and oriented to person, place, and time. He displays normal reflexes. No cranial nerve deficit.  Skin: No rash noted.  He has multiple nevi but no significant atypical features  Psychiatric: He has a normal mood and affect.          Assessment & Plan:  Complete physical. Establish more consistent exercise. Work on weight loss. Tetanus up-to-date. Colonoscopy last year.  Refilled Cialis for one year.

## 2013-04-11 ENCOUNTER — Encounter: Payer: Self-pay | Admitting: Family Medicine

## 2013-05-15 ENCOUNTER — Telehealth: Payer: Self-pay | Admitting: Family Medicine

## 2013-05-15 NOTE — Telephone Encounter (Signed)
Was prescribed Cialis 20mg  tabs every other day as needed on 03-29-13. His insurance will not cover this medicine and is cheaper to purchase them in 5mg  tablets. Would like MD to call in refill for Cialis 5mg  tab 4 every other day as needed to [facility]. Original prescription has refills but is cheaper to get if dosing is 5mg  tablets.  Patient can be reached at [phone].

## 2013-05-15 NOTE — Telephone Encounter (Signed)
We can try to send in rx for Cialis 5 mg, although some insurance companies are requiring prior authorization. The only approved dosing for Cialis 5mg  is one po daily #30 with 6 refills.

## 2013-05-15 NOTE — Telephone Encounter (Signed)
Is it okay to change dosage

## 2013-05-16 MED ORDER — TADALAFIL 5 MG PO TABS: 5.0000 mg | ORAL_TABLET | Freq: Every day | ORAL | Status: DC

## 2013-05-16 NOTE — Telephone Encounter (Signed)
Sent rx to pharmacy

## 2013-05-21 ENCOUNTER — Ambulatory Visit (INDEPENDENT_AMBULATORY_CARE_PROVIDER_SITE_OTHER): Payer: BC Managed Care – PPO | Admitting: Sports Medicine

## 2013-05-21 ENCOUNTER — Encounter: Payer: Self-pay | Admitting: Sports Medicine

## 2013-05-21 VITALS — BP 125/71 | Ht 72.0 in | Wt 230.0 lb

## 2013-05-21 DIAGNOSIS — M25569 Pain in unspecified knee: Secondary | ICD-10-CM

## 2013-05-21 DIAGNOSIS — M25562 Pain in left knee: Secondary | ICD-10-CM

## 2013-05-21 DIAGNOSIS — M1710 Unilateral primary osteoarthritis, unspecified knee: Secondary | ICD-10-CM

## 2013-05-21 DIAGNOSIS — M1712 Unilateral primary osteoarthritis, left knee: Secondary | ICD-10-CM | POA: Insufficient documentation

## 2013-05-21 NOTE — Patient Instructions (Addendum)
Dear Mr. Harbuck,   Thanks for coming in today. The pain that you were feeling on the right front side of the knee is due to a partial tear of the patellar tendon that happened with [place] at the beach. It seems to be healing on the ultrasound. However, you still have fluid over the top of the knee cap which is due to the arthritis of the knee. You also have tears in the meniscus on both sides due to the arthritis.   For your swelling and arthritis pain, let's do the following things:  - Wear the compression sleeve on the left side while active with work or exercise as tolerated - Avoid running and stairs, but you can do biking and elliptical machines - Also please do the following exercises list on the sheet that will strengthen the quad - The insoles in the shoes will provide some more comfort with walking   Please come back in 2 months or sooner if needed.   Sincerely,   Dr. Darrick Penna and Dr. Clinton Sawyer

## 2013-05-21 NOTE — Assessment & Plan Note (Addendum)
Assessment: resolving small tear of patellar tendon Plan: use compression sleeve and quad strengthening exercises as noted above  If compression helps he may want to use on RT as well

## 2013-05-21 NOTE — Assessment & Plan Note (Signed)
Assessment: patient with degenerative joint disease causing medial and lateral meniscal tears as well as a persistent effusion the quadriceps tendon leading to instability Plan: patient will use a body helix compression sleeve to reduce the effusion the left side. Additionally arch supports were placed on his insoles to help relieve stress to the medial compartment of the knee. He will engage in quad strengthening exercises. Additionally, he will bike and using elliptical machine but will not run. Patient will follow-up in 8 weeks.

## 2013-05-21 NOTE — Progress Notes (Signed)
Subjective:    Patient ID: Martin Bennett., male    DOB: August 10, 1966, 47 y.o.   MRN: [id number]  HPI  47 year old male with known history of osteoarthritis of the knees who presents for evaluation of left knee pain. The patient had new onset left knee pain in August. It started when he jumped in the sand on the beach. He immediately had pain and anterior knee distal to the patella. He had difficulty ambulating for 2 days thereafter. He treated with ice, rest, and anti-inflammatory meds. He saw Dr. Valentina Gu problem in August. At that time, Dr. Valentina Gu suggested injection with steroids, and injection with a lubricant, or surgery. The patient chose not to have any of these interventions. Approximately 2 weeks ago the patient had severe anterior knee pain again he could not fully extend his leg. He iced the knee and took anti-inflammatories, and it has improved dramatically in the last 2 weeks. The patient recently started taking vitamin D 2000 units daily and chondroitin. He is also concerned about his knees "give way" recently, but he does not fall.   Social: The patient works for [employer] and does a lot of bending and heavy lifting each day.    Past Medical History  Diagnosis Date  . Headache(784.0)   . Arthritis     osetoarthritis   . Hemorrhoids    Past Surgical History  Procedure Laterality Date  . Tonsillectomy  1974  . Bone graft l hand  1992  . Knee arthroscopy  2012    L knee  . Knee arthroscopy  2013    rt knee   Family History  Problem Relation Age of Onset  . Hypertension Mother   . Arthritis Other   . Colon cancer Neg Hx   . Breast cancer Mother    History   Social History  . Marital Status: Married    Spouse Name: N/A    Number of Children: 2  . Years of Education: N/A   Occupational History  . [employer]    Social History Main Topics  . Smoking status: Never Smoker   . Smokeless tobacco: Never Used  . Alcohol Use: 0.6 oz/week    1 Cans of beer per week     Comment: occ   . Drug Use: No  . Sexual Activity: Not on file   Other Topics Concern  . Not on file   Social History Narrative  . No narrative on file     Review of Systems See history of present illness    Objective:   Physical Exam BP 125/71  Ht 6' (1.829 m)  Wt 230 lb (104.327 kg)  BMI 31.19 kg/m2  Gen. Over weight, white male, well-appearing, pleasant and conversant Genu Varum bilaterally and leg length equal bilaterally Left knee with - 3 deg of full extension and flexion to 120. There no effusions or joint internist palpation along the medial or lateral joint lines. Negative Lachman's and McMurray's test.  Full strength of knee extension and flexion bilaterally  RT knee with -2 deg extension and 130 deg of flexion  MS[facility] Ultrasound: Right knee - longitudinal view demonstrated 6-7 cm effusion extending from the distal quadriceps tendon superior to the patella up into thigh. Additionally, there is evidence of medial lateral meniscal tears.  There is increased hypoechoic change in the lower patella tendon insertion of the tendon fibers consistent with recent tear of the patellar tendon with healing. There was marked degenerative changes of the joint line bilaterally. Left  knee -  longitudinal view demonstrated 6-7 cm fusion around the distal quadriceps tendon superior to the patella. Additionally, there is evidence of medial lateral meniscal tears. There was marked degenerative changes of the joint line bilaterally.     Assessment & Plan:

## 2013-12-17 ENCOUNTER — Encounter: Payer: Self-pay | Admitting: Family Medicine

## 2013-12-17 ENCOUNTER — Ambulatory Visit (INDEPENDENT_AMBULATORY_CARE_PROVIDER_SITE_OTHER): Payer: BC Managed Care – PPO | Admitting: Family Medicine

## 2013-12-17 VITALS — BP 120/66 | HR 78 | Temp 97.9°F | Wt 234.0 lb

## 2013-12-17 DIAGNOSIS — M549 Dorsalgia, unspecified: Secondary | ICD-10-CM

## 2013-12-17 DIAGNOSIS — K219 Gastro-esophageal reflux disease without esophagitis: Secondary | ICD-10-CM

## 2013-12-17 NOTE — Patient Instructions (Signed)
Heartburn  Heartburn is a painful, burning sensation in the chest. It may feel worse in certain positions, such as lying down or bending over. It is caused by stomach acid backing up into the tube that carries food from the mouth down to the stomach (lower esophagus).   CAUSES   · Large meals.  · Certain foods and drinks.  · Exercise.  · Increased acid production.  · Being overweight or obese.  · Certain medicines.  SYMPTOMS   · Burning pain in the chest or lower throat.  · Bitter taste in the mouth.  · Coughing.  DIAGNOSIS   If the usual treatments for heartburn do not improve your symptoms, then tests may be done to see if there is another condition present. Possible tests may include:  · X-rays.  · Endoscopy. This is when a tube with a light and a camera on the end is used to examine the esophagus and the stomach.  · A test to measure the amount of acid in the esophagus (pH test).  · A test to see if the esophagus is working properly (esophageal manometry).  · Blood, breath, or stool tests to check for bacteria that cause ulcers.  TREATMENT   · Your caregiver may tell you to use certain over-the-counter medicines (antacids, acid reducers) for mild heartburn.  · Your caregiver may prescribe medicines to decrease the acid in your stomach or protect your stomach lining.  · Your caregiver may recommend certain diet changes.  · For severe cases, your caregiver may recommend that the head of your bed be elevated on blocks. (Sleeping with more pillows is not an effective treatment as it only changes the position of your head and does not improve the main problem of stomach acid refluxing into the esophagus.)  HOME CARE INSTRUCTIONS   · Take all medicines as directed by your caregiver.  · Raise the head of your bed by putting blocks under the legs if instructed to by your caregiver.  · Do not exercise right after eating.  · Avoid eating 2 or 3 hours before bed. Do not lie down right after eating.  · Eat small meals  throughout the day instead of 3 large meals.  · Stop smoking if you smoke.  · Maintain a healthy weight.  · Identify foods and beverages that make your symptoms worse and avoid them. Foods you may want to avoid include:  · Peppers.  · Chocolate.  · High-fat foods, including fried foods.  · Spicy foods.  · Garlic and onions.  · Citrus fruits, including oranges, grapefruit, lemons, and limes.  · Food containing tomatoes or tomato products.  · Mint.  · Carbonated drinks, caffeinated drinks, and alcohol.  · Vinegar.  SEEK IMMEDIATE MEDICAL CARE IF:  · You have severe chest pain that goes down your arm or into your jaw or neck.  · You feel sweaty, dizzy, or lightheaded.  · You are short of breath.  · You vomit blood.  · You have difficulty or pain with swallowing.  · You have bloody or black, tarry stools.  · You have episodes of heartburn more than 3 times a week for more than 2 weeks.  MAKE SURE YOU:  · Understand these instructions.  · Will watch your condition.  · Will get help right away if you are not doing well or get worse.  Document Released: 01/08/2009 Document Revised: 11/14/2011 Document Reviewed: 02/06/2011  ExitCare® Patient Information ©2014 [employer].

## 2013-12-17 NOTE — Progress Notes (Signed)
Pre visit review using our clinic review tool, if applicable. No additional management support is needed unless otherwise documented below in the visit note. 

## 2013-12-17 NOTE — Progress Notes (Signed)
   Subjective:    Patient ID: Martin Welcher., male    DOB: [birth year], 48 y.o.   MRN: [id number]  Heartburn He complains of heartburn. He reports no chest pain, no coughing or no nausea.  Back Pain Pertinent negatives include no chest pain or fever.   Patient seen with complaints of possible heartburn yesterday. He woke up with burning sensation epigastric region radiating up toward his midsternal region. He does not have any prior history of heartburn. He took some Advil last night for back pain otherwise not take any regular nonsteroidals. Rare alcohol use. Minimal caffeine use. Denies any chest pain. No dyspnea. No exertional symptoms. No upper extremity pain.  He did not take any medications for relief. Denies any nausea or vomiting. No recent appetite or weight changes. Denies any pain with swallowing or dysphagia.  Patient also complains of diffuse back pain upper and lower back. This is bilateral and relatively mild severity. No clear triggering factors. Denies any recent injury. His job requires long lifting.  Past Medical History  Diagnosis Date  . Headache(784.0)   . Arthritis     osetoarthritis   . Hemorrhoids    Past Surgical History  Procedure Laterality Date  . Tonsillectomy  1974  . Bone graft l hand  1992  . Knee arthroscopy  2012    L knee  . Knee arthroscopy  2013    rt knee    reports that he has never smoked. He has never used smokeless tobacco. He reports that he drinks about .6 ounces of alcohol per week. He reports that he does not use illicit drugs. family history includes Arthritis in his other; Breast cancer in his mother; Hypertension in his mother. There is no history of Colon cancer. Allergies  Allergen Reactions  . Penicillins       Review of Systems  Constitutional: Negative for fever, chills, appetite change and unexpected weight change.  HENT: Negative for trouble swallowing.   Respiratory: Negative for cough and shortness of breath.     Cardiovascular: Negative for chest pain and palpitations.  Gastrointestinal: Positive for heartburn. Negative for nausea, vomiting, diarrhea, blood in stool and abdominal distention.  Musculoskeletal: Positive for back pain.       Objective:   Physical Exam  Constitutional: He appears well-developed and well-nourished.  HENT:  Mouth/Throat: Oropharynx is clear and moist.  Neck: Neck supple. No thyromegaly present.  Cardiovascular: Normal rate and regular rhythm.  Exam reveals no gallop.   No murmur heard. Pulmonary/Chest: Effort normal and breath sounds normal. No respiratory distress. He has no wheezes. He has no rales.  Abdominal: Soft. Bowel sounds are normal. He exhibits no distension and no mass. There is no tenderness. There is no rebound and no guarding.  Musculoskeletal: He exhibits no edema.          Assessment & Plan:  GERD. He is describing fairly classic GERD symptoms. He does not have any concerns for chest pain or other etiology. His symptoms were brief and  improved today. If they recur recommend over-the-counter antacid such as Zantac or Pepcid. Handout on GERD given.  Diffuse back pain. Suspect muscular. Reassurance. Avoid regular use of nonsteroidals

## 2014-03-26 ENCOUNTER — Encounter: Payer: Self-pay | Admitting: Family Medicine

## 2014-03-26 ENCOUNTER — Ambulatory Visit (INDEPENDENT_AMBULATORY_CARE_PROVIDER_SITE_OTHER): Payer: BC Managed Care – PPO | Admitting: Family Medicine

## 2014-03-26 VITALS — BP 124/68 | HR 69 | Temp 98.5°F | Wt 226.0 lb

## 2014-03-26 DIAGNOSIS — W57XXXA Bitten or stung by nonvenomous insect and other nonvenomous arthropods, initial encounter: Secondary | ICD-10-CM

## 2014-03-26 DIAGNOSIS — T148 Other injury of unspecified body region: Secondary | ICD-10-CM

## 2014-03-26 MED ORDER — TADALAFIL 5 MG PO TABS: 5.0000 mg | ORAL_TABLET | Freq: Every day | ORAL | Status: DC

## 2014-03-26 NOTE — Patient Instructions (Signed)
Tick Bite Information Ticks are insects that attach themselves to the skin and draw blood for food. There are various types of ticks. Common types include wood ticks and deer ticks. Most ticks live in shrubs and grassy areas. Ticks can climb onto your body when you make contact with leaves or grass where the tick is waiting. The most common places on the body for ticks to attach themselves are the scalp, neck, armpits, waist, and groin. Most tick bites are harmless, but sometimes ticks carry germs that cause diseases. These germs can be spread to a person during the tick's feeding process. The chance of a disease spreading through a tick bite depends on:   The type of tick.  Time of year.   How long the tick is attached.   Geographic location.  HOW CAN YOU PREVENT TICK BITES? Take these steps to help prevent tick bites when you are outdoors:  Wear protective clothing. Long sleeves and long pants are best.   Wear white clothes so you can see ticks more easily.  Tuck your pant legs into your socks.   If walking on a trail, stay in the middle of the trail to avoid brushing against bushes.  Avoid walking through areas with long grass.  Put insect repellent on all exposed skin and along boot tops, pant legs, and sleeve cuffs.   Check clothing, hair, and skin repeatedly and before going inside.   Brush off any ticks that are not attached.  Take a shower or bath as soon as possible after being outdoors.  WHAT IS THE PROPER WAY TO REMOVE A TICK? Ticks should be removed as soon as possible to help prevent diseases caused by tick bites. 1. If latex gloves are available, put them on before trying to remove a tick.  2. Using fine-point tweezers, grasp the tick as close to the skin as possible. You may also use curved forceps or a tick removal tool. Grasp the tick as close to its head as possible. Avoid grasping the tick on its body. 3. Pull gently with steady upward pressure until  the tick lets go. Do not twist the tick or jerk it suddenly. This may break off the tick's head or mouth parts. 4. Do not squeeze or crush the tick's body. This could force disease-carrying fluids from the tick into your body.  5. After the tick is removed, wash the bite area and your hands with soap and water or other disinfectant such as alcohol. 6. Apply a small amount of antiseptic cream or ointment to the bite site.  7. Wash and disinfect any instruments that were used.  Do not try to remove a tick by applying a hot match, petroleum jelly, or fingernail polish to the tick. These methods do not work and may increase the chances of disease being spread from the tick bite.  WHEN SHOULD YOU SEEK MEDICAL CARE? Contact your health care provider if you are unable to remove a tick from your skin or if a part of the tick breaks off and is stuck in the skin.  After a tick bite, you need to be aware of signs and symptoms that could be related to diseases spread by ticks. Contact your health care provider if you develop any of the following in the days or weeks after the tick bite:  Unexplained fever.  Rash. A circular rash that appears days or weeks after the tick bite may indicate the possibility of Lyme disease. The rash may resemble   a target with a bull's-eye and may occur at a different part of your body than the tick bite.  Redness and swelling in the area of the tick bite.   Tender, swollen lymph glands.   Diarrhea.   Weight loss.   Cough.   Fatigue.   Muscle, joint, or bone pain.   Abdominal pain.   Headache.   Lethargy or a change in your level of consciousness.  Difficulty walking or moving your legs.   Numbness in the legs.   Paralysis.  Shortness of breath.   Confusion.   Repeated vomiting.  Document Released: 08/19/2000 Document Revised: 06/12/2013 Document Reviewed: 01/30/2013 ExitCare Patient Information 2015 [employer]. This information is  not intended to replace advice given to you by your health care provider. Make sure you discuss any questions you have with your health care provider.  

## 2014-03-26 NOTE — Progress Notes (Signed)
   Subjective:    Patient ID: Martin Bennett., male    DOB: 05-21-1966, 48 y.o.   MRN: [id number]  HPI Tick bite approximately 3 months ago left buttock. He is not sure which type of tick. He thinks this was removed in entirety. Since that time, he had some itching and small erythematous papule. Never noted any other rashes. No headaches. No arthralgias. Generally feels well overall. Pruritus is minimal.  Past Medical History  Diagnosis Date  . Headache(784.0)   . Arthritis     osetoarthritis   . Hemorrhoids    Past Surgical History  Procedure Laterality Date  . Tonsillectomy  1974  . Bone graft l hand  1992  . Knee arthroscopy  2012    L knee  . Knee arthroscopy  2013    rt knee    reports that he has never smoked. He has never used smokeless tobacco. He reports that he drinks about .6 ounces of alcohol per week. He reports that he does not use illicit drugs. family history includes Arthritis in his other; Breast cancer in his mother; Hypertension in his mother. There is no history of Colon cancer. Allergies  Allergen Reactions  . Penicillins       Review of Systems  Constitutional: Negative for fever and chills.  Musculoskeletal: Negative for arthralgias.  Neurological: Negative for headaches.  Hematological: Negative for adenopathy.       Objective:   Physical Exam  Constitutional: He appears well-developed and well-nourished.  Cardiovascular: Normal rate and regular rhythm.   Pulmonary/Chest: Effort normal and breath sounds normal. No respiratory distress. He has no wheezes. He has no rales.  Skin:  Patient small papule minimally erythematous left upper buttock. No pustule. Nontender. very small pinpoint dark speck near the center only visible with magnification          Assessment & Plan:  Remote tick bite. He has nonspecific papule which could represent fibroma reaction from bite and cannot rule out possible retained tick part. We gave options of  observation versus biopsy of this region and he prefers the former. He is having minimal symptoms. Schedule complete physical

## 2014-03-26 NOTE — Progress Notes (Signed)
Pre visit review using our clinic review tool, if applicable. No additional management support is needed unless otherwise documented below in the visit note. 

## 2014-04-11 ENCOUNTER — Other Ambulatory Visit: Payer: BC Managed Care – PPO

## 2014-04-11 ENCOUNTER — Other Ambulatory Visit (INDEPENDENT_AMBULATORY_CARE_PROVIDER_SITE_OTHER): Payer: BC Managed Care – PPO

## 2014-04-11 DIAGNOSIS — Z0000 Encounter for general adult medical examination without abnormal findings: Secondary | ICD-10-CM

## 2014-04-18 ENCOUNTER — Encounter: Payer: Self-pay | Admitting: Family Medicine

## 2014-04-18 ENCOUNTER — Ambulatory Visit (INDEPENDENT_AMBULATORY_CARE_PROVIDER_SITE_OTHER): Payer: BC Managed Care – PPO | Admitting: Family Medicine

## 2014-04-18 VITALS — BP 110/70 | HR 75 | Temp 98.1°F | Ht 71.0 in | Wt 228.0 lb

## 2014-04-18 DIAGNOSIS — Z0000 Encounter for general adult medical examination without abnormal findings: Secondary | ICD-10-CM

## 2014-04-18 NOTE — Progress Notes (Signed)
   Subjective:    Patient ID: Martin Andreoni., male    DOB: June 04, 1966, 47 y.o.   MRN: [id number]  HPI Patient seen for complete physical. He has history of some osteoarthritis mostly involving the knees. Otherwise been healthy. He takes no regular medications. His tetanus is up-to-date. Colonoscopy 2 years ago secondary to hematochezia. Recommended five-year followup. He has history of internal hemorrhoids. Occasional bleeding off and on. Overall feels well. Has never smoked. No consistent exercise.  Past Medical History  Diagnosis Date  . Headache(784.0)   . Arthritis     osetoarthritis   . Hemorrhoids    Past Surgical History  Procedure Laterality Date  . Tonsillectomy  1974  . Bone graft l hand  1992  . Knee arthroscopy  2012    L knee  . Knee arthroscopy  2013    rt knee    reports that he has never smoked. He has never used smokeless tobacco. He reports that he drinks about .6 ounces of alcohol per week. He reports that he does not use illicit drugs. family history includes Arthritis in his other; Breast cancer in his mother; Hypertension in his mother. There is no history of Colon cancer. Allergies  Allergen Reactions  . Penicillins       Review of Systems  Constitutional: Negative for fever, activity change, appetite change and fatigue.  HENT: Negative for congestion, ear pain and trouble swallowing.   Eyes: Negative for pain and visual disturbance.  Respiratory: Negative for cough, shortness of breath and wheezing.   Cardiovascular: Negative for chest pain and palpitations.  Gastrointestinal: Negative for nausea, vomiting, abdominal pain, diarrhea, constipation, abdominal distention and rectal pain.  Genitourinary: Negative for dysuria, hematuria and testicular pain.  Musculoskeletal: Negative for joint swelling.  Skin: Negative for rash.  Neurological: Negative for dizziness, syncope and headaches.  Hematological: Negative for adenopathy.    Psychiatric/Behavioral: Negative for confusion and dysphoric mood.       Objective:   Physical Exam  Constitutional: He is oriented to person, place, and time. He appears well-developed and well-nourished. No distress.  HENT:  Head: Normocephalic and atraumatic.  Right Ear: External ear normal.  Left Ear: External ear normal.  Mouth/Throat: Oropharynx is clear and moist.  Eyes: Conjunctivae and EOM are normal. Pupils are equal, round, and reactive to light.  Neck: Normal range of motion. Neck supple. No thyromegaly present.  Cardiovascular: Normal rate, regular rhythm and normal heart sounds.   No murmur heard. Pulmonary/Chest: No respiratory distress. He has no wheezes. He has no rales.  Abdominal: Soft. Bowel sounds are normal. He exhibits no distension and no mass. There is no tenderness. There is no rebound and no guarding.  Musculoskeletal: He exhibits no edema.  Lymphadenopathy:    He has no cervical adenopathy.  Neurological: He is alert and oriented to person, place, and time. He displays normal reflexes. No cranial nerve deficit.  Skin: No rash noted.  Several scattered nevi but none with atypical changes.  Psychiatric: He has a normal mood and affect.          Assessment & Plan:  Complete physical. Colonoscopy up to date. Repeat in 3 years. Labs reviewed with no major concerns. Tetanus up-to-date.

## 2014-04-18 NOTE — Progress Notes (Signed)
Pre visit review using our clinic review tool, if applicable. No additional management support is needed unless otherwise documented below in the visit note. 

## 2014-05-13 ENCOUNTER — Encounter: Payer: Self-pay | Admitting: Family Medicine

## 2014-06-04 ENCOUNTER — Telehealth: Payer: Self-pay | Admitting: Family Medicine

## 2014-06-04 MED ORDER — TADALAFIL 20 MG PO TABS: ORAL_TABLET | ORAL | Status: DC

## 2014-06-04 NOTE — Telephone Encounter (Signed)
Called pt.  We generated new rx for Cialis which he will pick up as he wishes to send this off.

## 2014-06-04 NOTE — Telephone Encounter (Signed)
Pt states Dr. Elease Hashimoto called him and requested the best time to call him back. Pt states anytime between now and 3 pm today.

## 2015-01-26 ENCOUNTER — Telehealth: Payer: Self-pay | Admitting: Family Medicine

## 2015-01-26 ENCOUNTER — Encounter: Payer: Self-pay | Admitting: Family Medicine

## 2015-01-26 ENCOUNTER — Ambulatory Visit (INDEPENDENT_AMBULATORY_CARE_PROVIDER_SITE_OTHER): Payer: BLUE CROSS/BLUE SHIELD | Admitting: Family Medicine

## 2015-01-26 VITALS — BP 126/80 | HR 61 | Temp 97.9°F | Wt 219.0 lb

## 2015-01-26 DIAGNOSIS — L03011 Cellulitis of right finger: Secondary | ICD-10-CM | POA: Diagnosis not present

## 2015-01-26 DIAGNOSIS — N528 Other male erectile dysfunction: Secondary | ICD-10-CM

## 2015-01-26 DIAGNOSIS — K4090 Unilateral inguinal hernia, without obstruction or gangrene, not specified as recurrent: Secondary | ICD-10-CM

## 2015-01-26 MED ORDER — CEPHALEXIN 500 MG PO CAPS: 500.0000 mg | ORAL_CAPSULE | Freq: Three times a day (TID) | ORAL | Status: DC

## 2015-01-26 MED ORDER — SILDENAFIL CITRATE 20 MG PO TABS: ORAL_TABLET | ORAL | Status: DC

## 2015-01-26 NOTE — Progress Notes (Signed)
Pre visit review using our clinic review tool, if applicable. No additional management support is needed unless otherwise documented below in the visit note. 

## 2015-01-26 NOTE — Patient Instructions (Signed)
Warm water soaks several times daily and be in touch in one week if not improving.

## 2015-01-26 NOTE — Progress Notes (Signed)
   Subjective:    Patient ID: Martin Bennett., male    DOB: [birth year], 49 y.o.   MRN: [id number]  HPI Patient seen for the following issues  Right index finger soreness over the past week. No injury. No drainage. No fevers or chills. Mildly tender. Occasional soaks without improvement  Patient has history of erectile dysfunction. He is requesting prescription for generic sildenafil secondary to cost. No nitroglycerin use. No contraindications. He is a nonsmoker and has no history of peripheral vascular disease.  History of inguinal hernia. Occasionally uncomfortable. Requesting name of local surgeon.  Past Medical History  Diagnosis Date  . Headache(784.0)   . Arthritis     osetoarthritis   . Hemorrhoids    Past Surgical History  Procedure Laterality Date  . Tonsillectomy  1974  . Bone graft l hand  1992  . Knee arthroscopy  2012    L knee  . Knee arthroscopy  2013    rt knee    reports that he has never smoked. He has never used smokeless tobacco. He reports that he drinks about 0.6 oz of alcohol per week. He reports that he does not use illicit drugs. family history includes Arthritis in his other; Breast cancer in his mother; Hypertension in his mother. There is no history of Colon cancer. Allergies  Allergen Reactions  . Penicillins       Review of Systems  Constitutional: Negative for fatigue and unexpected weight change.  Eyes: Negative for visual disturbance.  Respiratory: Negative for cough, chest tightness and shortness of breath.   Cardiovascular: Negative for chest pain, palpitations and leg swelling.  Neurological: Negative for dizziness, syncope, weakness, light-headedness and headaches.       Objective:   Physical Exam  Constitutional: He appears well-developed and well-nourished.  Cardiovascular: Normal rate and regular rhythm.   Pulmonary/Chest: Effort normal and breath sounds normal. No respiratory distress. He has no wheezes. He has no rales.    Skin:  Right index finger reveals some erythema along the lateral border of the nail with mild edema but no visible purulence. Minimally tender to palpation.          Assessment & Plan:  #1 cellulitis right index finger. Possible early paronychia. No obvious purulence or fluctuance.  Warm saltwater soaks several times daily. Keflex 500 mg 3 times a day for 10 days. Follow-up in one week if not resolving #2 erectile dysfunction. Prescription for generic Viagra 20 mg 2-5 once daily as needed for sexual activity.

## 2015-01-26 NOTE — Telephone Encounter (Signed)
Noted  

## 2015-01-26 NOTE — Telephone Encounter (Signed)
Patient Name: Martin Bennett DOB: October 18, 1965 Initial Comment Caller states he has a small infection on right index finger for a week. Hasn't went away Nurse Assessment Nurse: Marcelline Deist, RN, Kermit Balo Date/Time Eilene Ghazi Time): 01/26/2015 9:38:18 AM Confirm and document reason for call. If symptomatic, describe symptoms. ---Caller states he has a small infection on right index finger for a week. Hasn't gone away. It is red & puffy around nail. Has tried Hydrogen Peroxide & antibiotic cream. No fever. Has the patient traveled out of the country within the last 30 days? ---Not Applicable Does the patient require triage? ---Yes Related visit to physician within the last 2 weeks? ---No Does the PT have any chronic conditions? (i.e. diabetes, asthma, etc.) ---No Guidelines Guideline Title Affirmed Question Affirmed Notes Finger Pain Looks infected (spreading redness, pus) (Exception: localized redness and swelling of skin around nail) Final Disposition User See Physician within [facility], Therapist, sports, [place]

## 2015-02-04 ENCOUNTER — Telehealth: Payer: Self-pay | Admitting: Gastroenterology

## 2015-02-05 NOTE — Telephone Encounter (Signed)
Pt wanted to see Dr Ardis Hughs to discuss hemorrhoids.  He was recommended to see [facility] in 2013 but declines to see surgeon. Appt 04/13/15

## 2015-03-18 ENCOUNTER — Emergency Department (HOSPITAL_COMMUNITY)
Admission: EM | Admit: 2015-03-18 | Discharge: 2015-03-18 | Disposition: A | Discharge status: Home / Self Care | Payer: BLUE CROSS/BLUE SHIELD | Attending: Emergency Medicine | Admitting: Emergency Medicine

## 2015-03-18 ENCOUNTER — Emergency Department (HOSPITAL_COMMUNITY): Payer: BLUE CROSS/BLUE SHIELD

## 2015-03-18 ENCOUNTER — Encounter (HOSPITAL_COMMUNITY): Payer: Self-pay | Admitting: Emergency Medicine

## 2015-03-18 DIAGNOSIS — W172XXA Fall into hole, initial encounter: Secondary | ICD-10-CM | POA: Diagnosis not present

## 2015-03-18 DIAGNOSIS — M25561 Pain in right knee: Secondary | ICD-10-CM

## 2015-03-18 DIAGNOSIS — Y9389 Activity, other specified: Secondary | ICD-10-CM | POA: Insufficient documentation

## 2015-03-18 DIAGNOSIS — Z8719 Personal history of other diseases of the digestive system: Secondary | ICD-10-CM | POA: Insufficient documentation

## 2015-03-18 DIAGNOSIS — M1990 Unspecified osteoarthritis, unspecified site: Secondary | ICD-10-CM | POA: Diagnosis not present

## 2015-03-18 DIAGNOSIS — Z880 Allergy status to penicillin: Secondary | ICD-10-CM | POA: Diagnosis not present

## 2015-03-18 DIAGNOSIS — Y92007 Garden or yard of unspecified non-institutional (private) residence as the place of occurrence of the external cause: Secondary | ICD-10-CM | POA: Diagnosis not present

## 2015-03-18 DIAGNOSIS — Z792 Long term (current) use of antibiotics: Secondary | ICD-10-CM | POA: Insufficient documentation

## 2015-03-18 DIAGNOSIS — Y999 Unspecified external cause status: Secondary | ICD-10-CM | POA: Diagnosis not present

## 2015-03-18 DIAGNOSIS — S8991XA Unspecified injury of right lower leg, initial encounter: Secondary | ICD-10-CM | POA: Diagnosis present

## 2015-03-18 DIAGNOSIS — S8981XA Other specified injuries of right lower leg, initial encounter: Secondary | ICD-10-CM

## 2015-03-18 MED ORDER — IBUPROFEN 400 MG PO TABS
800.0000 mg | ORAL_TABLET | Freq: Once | ORAL | Status: AC
  Administered 2015-03-18: 800 mg via ORAL
  Filled 2015-03-18: qty 2

## 2015-03-18 MED ORDER — HYDROCODONE-ACETAMINOPHEN 5-325 MG PO TABS: 1.0000 | ORAL_TABLET | Freq: Four times a day (QID) | ORAL | Status: DC | PRN

## 2015-03-18 MED ORDER — HYDROCODONE-ACETAMINOPHEN 5-325 MG PO TABS
1.0000 | ORAL_TABLET | Freq: Once | ORAL | Status: AC
Start: 2015-03-18 — End: 2015-03-18
  Administered 2015-03-18: 1 via ORAL
  Filled 2015-03-18: qty 1

## 2015-03-18 NOTE — ED Provider Notes (Signed)
CSN: [id number]     Arrival date & time 03/18/15  2033 History   This chart was scribed for non-physician practitioner, Martin Butts PA-C working with Martin Ferguson, MD by Martin Bennett, ED Scribe. This patient was seen in room TR06C/TR06C and the patient's care was started at 10:41 PM.   Chief Complaint  Patient presents with  . Knee Pain   The history is provided by the patient and medical records. No language interpreter was used.    HPI Comments: Martin Fitz. is a 49 y.o. male without any pertinent past medical history who presents to the Emergency Department complaining of constant, ongoing, unchanged R knee pain onset 9:30 PM this evening. Currently he rates pain 10/10. Pain is exacerbated with certain movements, ambulation and weight bearing. There is also associated swelling to the knee. Pt states he fell into a depression in the ground while doing yard work and states his knee "gave away" and buckled backwards. No OTC medications or home remedies attempted prior to arrival. No recent fever or chills. No weakness, numbness, of loss of sensation. Pt with known allergy to Penicillins.   Past Medical History  Diagnosis Date  . Headache(784.0)   . Arthritis     osetoarthritis   . Hemorrhoids    Past Surgical History  Procedure Laterality Date  . Tonsillectomy  1974  . Bone graft l hand  1992  . Knee arthroscopy  2012    L knee  . Knee arthroscopy  2013    rt knee   Family History  Problem Relation Age of Onset  . Hypertension Mother   . Arthritis Other   . Colon cancer Neg Hx   . Breast cancer Mother    History  Substance Use Topics  . Smoking status: Never Smoker   . Smokeless tobacco: Never Used  . Alcohol Use: 0.6 oz/week    1 Cans of beer per week     Comment: occ    Review of Systems  Constitutional: Negative for fever and chills.  Gastrointestinal: Negative for nausea and vomiting.  Musculoskeletal: Positive for joint swelling, arthralgias and  gait problem (2/2 pain). Negative for back pain, neck pain and neck stiffness.  Skin: Negative for wound.  Neurological: Negative for weakness and numbness.  Hematological: Does not bruise/bleed easily.  Psychiatric/Behavioral: The patient is not nervous/anxious.   All other systems reviewed and are negative.     Allergies  Penicillins  Home Medications   Prior to Admission medications   Medication Sig Start Date End Date Taking? Authorizing Provider  cephALEXin (KEFLEX) 500 MG capsule Take 1 capsule (500 mg total) by mouth 3 (three) times daily. 01/26/15   Martin Post, MD  HYDROcodone-acetaminophen (NORCO/VICODIN) 5-325 MG per tablet Take 1-2 tablets by mouth every 6 (six) hours as needed for moderate pain or severe pain. 03/18/15   Martin Tamer, PA-C  sildenafil (REVATIO) 20 MG tablet Take 2 to 5 tablets once daily as needed for sexual activity 01/26/15   Martin Post, MD   Triage Vitals: BP 116/73 mmHg  Pulse 72  Temp(Src) 98.1 F (36.7 C) (Oral)  Resp 20  SpO2 96%   Physical Exam  Constitutional: He appears well-developed and well-nourished. No distress.  HENT:  Head: Normocephalic and atraumatic.  Eyes: Conjunctivae are normal.  Neck: Normal range of motion.  Cardiovascular: Normal rate, regular rhythm and intact distal pulses.   Capillary refill < 3 sec  Pulmonary/Chest: Effort normal and breath sounds normal.  Musculoskeletal:  He exhibits tenderness. He exhibits no edema.  ROM: FROM of R knee Moderate joint effusion  No foot drop with leg raise, no palpable deformity of patellar tendon No abnormal patellar movement   Neurological: He is alert. Coordination normal.  Sensation is intact to dull and sharp to RLE Strength 5/5 in RLE including flexion and extension of the R knee   Skin: Skin is warm and dry. He is not diaphoretic.  No tenting of the skin  Psychiatric: He has a normal mood and affect.  Nursing note and vitals reviewed.   ED Course   Procedures (including critical care time)  DIAGNOSTIC STUDIES: Oxygen Saturation is 96% on RA, adequate by my interpretation.    COORDINATION OF CARE: 10:44 PM- Will order DG knee complete 4 views R. Discussed treatment plan with pt at bedside and pt agreed to plan.     Labs Review Labs Reviewed - No data to display  Imaging Review Dg Knee Complete 4 Views Right  03/18/2015   CLINICAL DATA:  Knee pain  EXAM: RIGHT KNEE - COMPLETE 4+ VIEW  COMPARISON:  None.  FINDINGS: No acute fracture or dislocation. No joint effusion. Tricompartmental osteoarthritis of the right knee most severe in the patellofemoral compartment.  IMPRESSION: No acute osseous injury of the right knee.   Electronically Signed   By: Martin Bennett   On: 03/18/2015 22:04     EKG Interpretation None      MDM   Final diagnoses:  Arthralgia of right knee  Knee hyperextension injury, right, initial encounter   Martin Bennett. presents with right knee after hyperextension injury. No evidence of damage to the patellar tendon. Patient without pain at rest but increased pain with ambulation.  Patient X-Ray negative for obvious fracture or dislocation. Pain managed in ED. Pt advised to follow up with orthopedics if symptoms persist for possibility of missed fracture diagnosis. Patient given brace while in ED, conservative therapy recommended and discussed. Patient will be dc home & is agreeable with above plan.  BP 116/73 mmHg  Pulse 72  Temp(Src) 98.1 F (36.7 C) (Oral)  Resp 20  SpO2 96%  I personally performed the services described in this documentation, which was scribed in my presence. The recorded information has been reviewed and is accurate.   Martin Soho Robin Pafford, PA-C 03/18/15 2255  Martin Ferguson, MD 03/18/15 [phone]

## 2015-03-18 NOTE — Discharge Instructions (Signed)
1. Medications: vicodin, ibuprofen 800mg  3x per day with food, usual home medications 2. Treatment: rest, drink plenty of fluids, use knee sleeve, use crutches as needed, elevate, ice 3. Follow Up: Please followup with your orthopedist in 5 days for discussion of your diagnoses and further evaluation after today's visit; if you do not have a primary care doctor use the resource guide provided to find one; Please return to the ER for worsening symptoms    Arthralgia Your caregiver has diagnosed you as suffering from an arthralgia. Arthralgia means there is pain in a joint. This can come from many reasons including:  Bruising the joint which causes soreness (inflammation) in the joint.  Wear and tear on the joints which occur as we grow older (osteoarthritis).  Overusing the joint.  Various forms of arthritis.  Infections of the joint. Regardless of the cause of pain in your joint, most of these different pains respond to anti-inflammatory drugs and rest. The exception to this is when a joint is infected, and these cases are treated with antibiotics, if it is a bacterial infection. HOME CARE INSTRUCTIONS   Rest the injured area for as long as directed by your caregiver. Then slowly start using the joint as directed by your caregiver and as the pain allows. Crutches as directed may be useful if the ankles, knees or hips are involved. If the knee was splinted or casted, continue use and care as directed. If an stretchy or elastic wrapping bandage has been applied today, it should be removed and re-applied every 3 to 4 hours. It should not be applied tightly, but firmly enough to keep swelling down. Watch toes and feet for swelling, bluish discoloration, coldness, numbness or excessive pain. If any of these problems (symptoms) occur, remove the ace bandage and re-apply more loosely. If these symptoms persist, contact your caregiver or return to this location.  For the first 24 hours, keep the  injured extremity elevated on pillows while lying down.  Apply ice for 15-20 minutes to the sore joint every couple hours while awake for the first half day. Then 03-04 times per day for the first 48 hours. Put the ice in a plastic bag and place a towel between the bag of ice and your skin.  Wear any splinting, casting, elastic bandage applications, or slings as instructed.  Only take over-the-counter or prescription medicines for pain, discomfort, or fever as directed by your caregiver. Do not use aspirin immediately after the injury unless instructed by your physician. Aspirin can cause increased bleeding and bruising of the tissues.  If you were given crutches, continue to use them as instructed and do not resume weight bearing on the sore joint until instructed. Persistent pain and inability to use the sore joint as directed for more than 2 to 3 days are warning signs indicating that you should see a caregiver for a follow-up visit as soon as possible. Initially, a hairline fracture (break in bone) may not be evident on X-rays. Persistent pain and swelling indicate that further evaluation, non-weight bearing or use of the joint (use of crutches or slings as instructed), or further X-rays are indicated. X-rays may sometimes not show a small fracture until a week or 10 days later. Make a follow-up appointment with your own caregiver or one to whom we have referred you. A radiologist (specialist in reading X-rays) may read your X-rays. Make sure you know how you are to obtain your X-ray results. Do not assume everything is normal if  you do not hear from [place]. SEEK MEDICAL CARE IF: Bruising, swelling, or pain increases. SEEK IMMEDIATE MEDICAL CARE IF:   Your fingers or toes are numb or blue.  The pain is not responding to medications and continues to stay the same or get worse.  The pain in your joint becomes severe.  You develop a fever over 102 F (38.9 C).  It becomes impossible to move or  use the joint. MAKE SURE YOU:   Understand these instructions.  Will watch your condition.  Will get help right away if you are not doing well or get worse. Document Released: 08/22/2005 Document Revised: 11/14/2011 Document Reviewed: 04/09/2008 [facility] Patient Information 2015 [place], [place]. This information is not intended to replace advice given to you by your health care provider. Make sure you discuss any questions you have with your health care provider.

## 2015-03-18 NOTE — ED Notes (Addendum)
Pt states his R knee "gave way" while working in the yard approx 2 hours ago.  C/o R knee pain and swelling.  Pt ambulatory with crutches on arrival to  ED.  Denies pain while sitting but states pain 10/10 when he gets up.

## 2015-04-13 ENCOUNTER — Ambulatory Visit (INDEPENDENT_AMBULATORY_CARE_PROVIDER_SITE_OTHER): Payer: BLUE CROSS/BLUE SHIELD | Admitting: Gastroenterology

## 2015-04-13 ENCOUNTER — Other Ambulatory Visit: Payer: BLUE CROSS/BLUE SHIELD

## 2015-04-13 ENCOUNTER — Other Ambulatory Visit (INDEPENDENT_AMBULATORY_CARE_PROVIDER_SITE_OTHER): Payer: BLUE CROSS/BLUE SHIELD

## 2015-04-13 ENCOUNTER — Encounter: Payer: Self-pay | Admitting: Emergency Medicine

## 2015-04-13 ENCOUNTER — Encounter: Payer: Self-pay | Admitting: Gastroenterology

## 2015-04-13 VITALS — BP 110/60 | HR 72 | Ht 70.0 in | Wt 218.4 lb

## 2015-04-13 DIAGNOSIS — K625 Hemorrhage of anus and rectum: Secondary | ICD-10-CM | POA: Diagnosis not present

## 2015-04-13 DIAGNOSIS — Z0000 Encounter for general adult medical examination without abnormal findings: Secondary | ICD-10-CM | POA: Diagnosis not present

## 2015-04-13 LAB — CBC WITH DIFFERENTIAL/PLATELET
BASOS ABS: 0 10*3/uL (ref 0.0–0.1)
Basophils Relative: 0 % (ref 0–1)
EOS PCT: 2 % (ref 0–5)
Eosinophils Absolute: 0.1 10*3/uL (ref 0.0–0.7)
HCT: 43.6 % (ref 39.0–52.0)
Hemoglobin: 15.2 g/dL (ref 13.0–17.0)
Lymphocytes Relative: 24 % (ref 12–46)
Lymphs Abs: 1.5 10*3/uL (ref 0.7–4.0)
MCH: 30 pg (ref 26.0–34.0)
MCHC: 34.9 g/dL (ref 30.0–36.0)
MCV: 86 fL (ref 78.0–100.0)
MONOS PCT: 8 % (ref 3–12)
MPV: 10.7 fL (ref 8.6–12.4)
Monocytes Absolute: 0.5 10*3/uL (ref 0.1–1.0)
NEUTROS ABS: 4.1 10*3/uL (ref 1.7–7.7)
NEUTROS PCT: 66 % (ref 43–77)
PLATELETS: 206 10*3/uL (ref 150–400)
RBC: 5.07 MIL/uL (ref 4.22–5.81)
RDW: 13.6 % (ref 11.5–15.5)
WBC: 6.2 10*3/uL (ref 4.0–10.5)

## 2015-04-13 NOTE — Patient Instructions (Signed)
Your bleeding is likely from hemorrhoids. Call if bleeding worsens or if you are very interested in sigmoidoscopy, otherwise colonoscopy again in 2 years for polyp surveillance.

## 2015-04-13 NOTE — Progress Notes (Signed)
Review of pertinent gastrointestinal problems: 1. Adenomatous polyp: colonoscopy Dr. Ardis Hughs 01/2012 single small polyp, recall at 5 years 2. Medium to large external hemorrhoids, referred to surgery 01/2012 however he declined  HPI: This is a very pleasant 49 year old man whom I last saw a little over 3 years ago.  Chief complaint is rectal bleeding   Has had interimttent bleeding.  He was not interested in surgcial referral.  He is very regular, never strain.  In a month, he will see bleeding 1-2 times, sometimes lasts for a week.   Past Medical History  Diagnosis Date  . Headache(784.0)   . Arthritis     osetoarthritis   . Hemorrhoids     Past Surgical History  Procedure Laterality Date  . Tonsillectomy  1974  . Bone graft l hand  1992  . Knee arthroscopy  2012    L knee  . Knee arthroscopy  2013    rt knee    Current Outpatient Prescriptions  Medication Sig Dispense Refill  . sildenafil (REVATIO) 20 MG tablet Take 2 to 5 tablets once daily as needed for sexual activity 30 tablet 11   No current facility-administered medications for this visit.    Allergies as of 04/13/2015 - Review Complete 04/13/2015  Allergen Reaction Noted  . Penicillins  01/25/2010    Family History  Problem Relation Age of Onset  . Hypertension Mother   . Arthritis Other   . Colon cancer Neg Hx   . Breast cancer Mother     History   Social History  . Marital Status: Married    Spouse Name: N/A  . Number of Children: 2  . Years of Education: N/A   Occupational History  . [employer]    Social History Main Topics  . Smoking status: Never Smoker   . Smokeless tobacco: Never Used  . Alcohol Use: 0.6 oz/week    1 Cans of beer per week     Comment: occ  . Drug Use: No  . Sexual Activity: Not on file   Other Topics Concern  . Not on file   Social History Narrative     Physical Exam: BP 110/60 mmHg  Pulse 72  Ht 5\' 10"  (1.778 m)  Wt 218 lb 6 oz (99.054 kg)  BMI 31.33  kg/m2 Constitutional: generally well-appearing Psychiatric: alert and oriented x3 Abdomen: soft, nontender, nondistended, no obvious ascites, no peritoneal signs, normal bowel sounds Rectal examination: Medium sized external hemorrhoids on examination, internally no distal rectal masses, stool was brown.  Assessment and plan: 49 y.o. male with external anal hemorrhoids  He has had bleeding from his hemorrhoids for a long time, even before the colonoscopy 2013. In fact that was the reason for that colonoscopy. He is concerned something else is going on such as polyp or cancer however I believe I reassured him that that is very unlikely to be the case given the colonoscopy just 3 years ago. He is due for surveillance colonoscopy in 2 years from now. I again offered referral to general surgery for external hemorrhoid resection however he was not at all interested. He says he can put up with the intermittent bleeding.   Martin Loffler, Martin Bennett [facility] 04/13/2015, 3:50 PM

## 2015-04-14 LAB — BASIC METABOLIC PANEL
BUN: 20 mg/dL (ref 7–25)
CALCIUM: 9.1 mg/dL (ref 8.6–10.3)
CHLORIDE: 105 mmol/L (ref 98–110)
CO2: 27 mmol/L (ref 20–31)
Creat: 1.04 mg/dL (ref 0.60–1.35)
Glucose, Bld: 78 mg/dL (ref 65–99)
Potassium: 4.1 mmol/L (ref 3.5–5.3)
SODIUM: 141 mmol/L (ref 135–146)

## 2015-04-14 LAB — LIPID PANEL
CHOLESTEROL: 171 mg/dL (ref 125–200)
HDL: 48 mg/dL (ref >=40)
LDL CALC: 102 mg/dL (ref <130)
Total CHOL/HDL Ratio: 3.6 Ratio (ref <=5.0)
Triglycerides: 104 mg/dL (ref <150)
VLDL: 21 mg/dL (ref <30)

## 2015-04-14 LAB — HEPATIC FUNCTION PANEL
ALK PHOS: 68 U/L (ref 40–115)
ALT: 15 U/L (ref 9–46)
AST: 16 U/L (ref 10–40)
Albumin: 4.3 g/dL (ref 3.6–5.1)
BILIRUBIN DIRECT: 0.1 mg/dL (ref <=0.2)
Indirect Bilirubin: 0.6 mg/dL (ref 0.2–1.2)
TOTAL PROTEIN: 6.6 g/dL (ref 6.1–8.1)
Total Bilirubin: 0.7 mg/dL (ref 0.2–1.2)

## 2015-04-14 LAB — TSH: TSH: 2.714 u[IU]/mL (ref 0.350–4.500)

## 2015-04-20 ENCOUNTER — Encounter: Payer: BLUE CROSS/BLUE SHIELD | Admitting: Family Medicine

## 2015-04-30 ENCOUNTER — Ambulatory Visit (INDEPENDENT_AMBULATORY_CARE_PROVIDER_SITE_OTHER): Payer: BLUE CROSS/BLUE SHIELD | Admitting: Family Medicine

## 2015-04-30 ENCOUNTER — Encounter: Payer: Self-pay | Admitting: Family Medicine

## 2015-04-30 VITALS — BP 126/80 | HR 80 | Temp 98.5°F | Ht 70.0 in | Wt 218.0 lb

## 2015-04-30 DIAGNOSIS — Z0000 Encounter for general adult medical examination without abnormal findings: Secondary | ICD-10-CM

## 2015-04-30 NOTE — Progress Notes (Signed)
Pre visit review using our clinic review tool, if applicable. No additional management support is needed unless otherwise documented below in the visit note. 

## 2015-04-30 NOTE — Patient Instructions (Signed)
Benign Prostatic Hypertrophy The prostate gland is part of the reproductive system of men. A normal prostate is about the size and shape of a walnut. The prostate gland produces a fluid that is mixed with sperm to make semen. This gland surrounds the urethra and is located in front of the rectum and just below the bladder. The bladder is where urine is stored. The urethra is the tube through which urine passes from the bladder to get out of the body. The prostate grows as a man ages. An enlarged prostate not caused by cancer is called benign prostatic hypertrophy (BPH). An enlarged prostate can press on the urethra. This can make it harder to pass urine. In the early stages of enlargement, the bladder can get by with a narrowed urethra by forcing the urine through. If the problem gets worse, medical or surgical treatment may be required.  This condition should be followed by your health care provider. The accumulation of urine in the bladder can cause infection. Back pressure and infection can progress to bladder damage and kidney (renal) failure. If needed, your health care provider may refer you to a specialist in kidney and prostate disease (urologist). CAUSES  BPH is a common health problem in men older than 50 years. This condition is a normal part of aging. However, not all men will develop problems from this condition. If the enlargement grows away from the urethra, then there will not be any compression of the urethra and resistance to urine flow.If the growth is toward the urethra and compresses it, you will experience difficulty urinating.  SYMPTOMS   Not able to completely empty your bladder.  Getting up often during the night to urinate.  Need to urinate frequently during the day.  Difficultly starting urine flow.  Decrease in size and strength of your urine stream.  Dribbling after urination.  Pain on urination (more common with infection).  Inability to pass urine. This needs  immediate treatment.  The development of a urinary tract infection. DIAGNOSIS  These tests will help your health care provider understand your problem:  A thorough history and physical examination.  A urination history, with the number of times you urinate, the amounts of urine, the strength of the urine stream, and the feeling of emptiness or fullness after urinating.  A postvoid bladder scan that measures any amount of urine that may remain in your bladder after you finish urinating.  Digital rectal exam. In a rectal exam, your health care provider checks your prostate by putting a gloved, lubricated finger into your rectum to feel the back of your prostate gland. This exam detects the size of your gland and abnormal lumps or growths.  Exam of your urine (urinalysis).  Prostate specific antigen (PSA) screening. This is a blood test used to screen for prostate cancer.  Rectal ultrasonography. This test uses sound waves to electronically produce a picture of your prostate gland. TREATMENT  Once symptoms begin, your health care provider will monitor your condition. Of the men with this condition, one third will have symptoms that stabilize, one third will have symptoms that improve, and one third will have symptoms that progress in the first year. Mild symptoms may not need treatment. Simple observation and yearly exams may be all that is required. Medicines and surgery are options for more severe problems. Your health care provider can help you make an informed decision for what is best. Two classes of medicines are available for relief of prostate symptoms:  Medicines  that shrink the prostate. This helps relieve symptoms. These medicines take time to work, and it may be months before any improvement is seen.  Uncommon side effects include problems with sexual function.  Medicines to relax the muscle of the prostate. This also relieves the obstruction by reducing any compression on the  urethra.This group of medicines work much faster than those that reduce the size of the prostate gland. Usually, one can experience improvement in days to weeks..  Side effects can include dizziness, fatigue, lightheadedness, and retrograde ejaculation (diminished volume of ejaculate). Several types of surgical treatments are available for relief of prostate symptoms:  Transurethral resection of the prostate (TURP)--In this treatment, an instrument is inserted through opening at the tip of the penis. It is used to cut away pieces of the inner core of the prostate. The pieces are removed through the same opening of the penis. This removes the obstruction and helps get rid of the symptoms.  Transurethral incision (TUIP)--In this procedure, small cuts are made in the prostate. This lessens the prostates pressure on the urethra.  Transurethral microwave thermotherapy (TUMT)--This procedure uses microwaves to create heat. The heat destroys and removes a small amount of prostate tissue.  Transurethral needle ablation (TUNA)--This is a procedure that uses radio frequencies to do the same as TUMT.  Interstitial laser coagulation (ILC)--This is a procedure that uses a laser to do the same as TUMT and TUNA.  Transurethral electrovaporization (TUVP)--This is a procedure that uses electrodes to do the same as the procedures listed above. SEEK MEDICAL CARE IF:   You develop a fever.  There is unexplained back pain.  Symptoms are not helped by medicines prescribed.  You develop side effects from the medicine you are taking.  Your urine becomes very dark or has a bad smell.  Your lower abdomen becomes distended and you have difficulty passing your urine. SEEK IMMEDIATE MEDICAL CARE IF:   You are suddenly unable to urinate. This is an emergency. You should be seen immediately.  There are large amounts of blood or clots in the urine.  Your urinary problems become unmanageable.  You develop  lightheadedness, severe dizziness, or you feel faint.  You develop moderate to severe low back or flank pain.  You develop chills or fever. Document Released: 08/22/2005 Document Revised: 08/27/2013 Document Reviewed: 03/07/2013 [facility] Patient Information 2015 [place], [place]. This information is not intended to replace advice given to you by your health care provider. Make sure you discuss any questions you have with your health care provider.

## 2015-04-30 NOTE — Progress Notes (Signed)
   Subjective:    Patient ID: Martin Zoll., male    DOB: 1966-08-12, 49 y.o.   MRN: [id number]  HPI Patient for complete physical. Generally very healthy. He has no chronic medical problems other than some osteoarthritis of the knees. Takes no regular prescription medications. Tetanus up-to-date. He's had previous colonoscopy. Has internal hemorrhoids which bleed intermittently. No appetite or weight changes. Feels well overall. Never smoked  Past Medical History  Diagnosis Date  . Headache(784.0)   . Arthritis     osetoarthritis   . Hemorrhoids    Past Surgical History  Procedure Laterality Date  . Tonsillectomy  1974  . Bone graft l hand  1992  . Knee arthroscopy  2012    L knee  . Knee arthroscopy  2013    rt knee    reports that he has never smoked. He has never used smokeless tobacco. He reports that he drinks about 0.6 oz of alcohol per week. He reports that he does not use illicit drugs. family history includes Arthritis in his father and other; Breast cancer in his mother; Hypertension in his mother. There is no history of Colon cancer. Allergies  Allergen Reactions  . Penicillins       Review of Systems  Constitutional: Negative for fever, activity change, appetite change and fatigue.  HENT: Negative for congestion, ear pain and trouble swallowing.   Eyes: Negative for pain and visual disturbance.  Respiratory: Negative for cough, shortness of breath and wheezing.   Cardiovascular: Negative for chest pain and palpitations.  Gastrointestinal: Negative for nausea, vomiting, abdominal pain, diarrhea, constipation, blood in stool, abdominal distention and rectal pain.  Genitourinary: Negative for dysuria, hematuria and testicular pain.  Musculoskeletal: Negative for joint swelling and arthralgias.  Skin: Negative for rash.  Neurological: Negative for dizziness, syncope and headaches.  Hematological: Negative for adenopathy.  Psychiatric/Behavioral: Negative for  confusion and dysphoric mood.       Objective:   Physical Exam  Constitutional: He is oriented to person, place, and time. He appears well-developed and well-nourished. No distress.  HENT:  Head: Normocephalic and atraumatic.  Right Ear: External ear normal.  Left Ear: External ear normal.  Mouth/Throat: Oropharynx is clear and moist.  Eyes: Conjunctivae and EOM are normal. Pupils are equal, round, and reactive to light.  Neck: Normal range of motion. Neck supple. No thyromegaly present.  Cardiovascular: Normal rate, regular rhythm and normal heart sounds.   No murmur heard. Pulmonary/Chest: No respiratory distress. He has no wheezes. He has no rales.  Abdominal: Soft. Bowel sounds are normal. He exhibits no distension and no mass. There is no tenderness. There is no rebound and no guarding.  Genitourinary:  Testes are normal. He has small rounded mobile nontender probable right epididymal cyst. No testicular masses No hernia noted  Musculoskeletal: He exhibits no edema.  Lymphadenopathy:    He has no cervical adenopathy.  Neurological: He is alert and oriented to person, place, and time. He displays normal reflexes. No cranial nerve deficit.  Skin: No rash noted.  Several nevi but none with significant atypical features.  Psychiatric: He has a normal mood and affect.          Assessment & Plan:  Complete physical. Labs reviewed. No major concerns. Tetanus up-to-date. Follow-up when one year for physical and sooner as needed

## 2015-12-22 ENCOUNTER — Telehealth: Payer: Self-pay | Admitting: Family Medicine

## 2015-12-22 NOTE — Telephone Encounter (Signed)
Patient Name: Martin Bennett DOB: [birth year] Initial Comment Caller states has swelling and itching leg joins torso, suspects insect bite or hernia Nurse Assessment Nurse: Ronnald Ramp, RN, Miranda Date/Time (Eastern Time): 12/22/2015 9:11:22 AM Confirm and document reason for call. If symptomatic, describe symptoms. You must click the next button to save text entered. ---Caller states he noticed swelling and itching in the right groin crease yesterday. The area is about 3 inches. Denies pain. Has the patient traveled out of the country within the last 30 days? ---Not Applicable Does the patient have any new or worsening symptoms? ---Yes Will a triage be completed? ---Yes Related visit to physician within the last 2 weeks? ---No Does the PT have any chronic conditions? (i.e. diabetes, asthma, etc.) ---No Is this a behavioral health or substance abuse call? ---No Guidelines Guideline Title Affirmed Question Affirmed Notes Skin Lump or Localized Swelling [1] Swelling is painful to touch AND [2] no fever Final Disposition User See Physician within 24 Hours Ronnald Ramp, RN, Miranda Comments Appt scheduled for tomorrow 4/19 at 10:30am with Dr. Elease Hashimoto. Referrals REFERRED TO PCP OFFICE Disagree/Comply: Comply

## 2015-12-22 NOTE — Telephone Encounter (Signed)
Error/ltd ° °

## 2015-12-23 ENCOUNTER — Ambulatory Visit (INDEPENDENT_AMBULATORY_CARE_PROVIDER_SITE_OTHER): Payer: BLUE CROSS/BLUE SHIELD | Admitting: Family Medicine

## 2015-12-23 VITALS — BP 120/68 | HR 67 | Temp 98.1°F | Ht 70.0 in | Wt 229.2 lb

## 2015-12-23 DIAGNOSIS — L282 Other prurigo: Secondary | ICD-10-CM

## 2015-12-23 MED ORDER — TRIAMCINOLONE ACETONIDE 0.1 % EX CREA: 1.0000 "application " | TOPICAL_CREAM | Freq: Two times a day (BID) | CUTANEOUS | Status: DC | PRN

## 2015-12-23 NOTE — Progress Notes (Signed)
   Subjective:    Patient ID: Martin Velarde., male    DOB: 1966/09/05, 50 y.o.   MRN: [id number]  HPI  Patient seen with pruritis right inguinal region  First noted this past Monday. He noticed small amount of soft tissue swelling in this region and was concerned he may have a hernia. He has not had any associated pain. He also noticed a very small nonpainful nodule with punctate center down along the lower scrotal sac. Does not recall any specific bites. No history of recent tick bite. No recent fevers or chills.  Past Medical History  Diagnosis Date  . Headache(784.0)   . Arthritis     osetoarthritis   . Hemorrhoids    Past Surgical History  Procedure Laterality Date  . Tonsillectomy  1974  . Bone graft l hand  1992  . Knee arthroscopy  2012    L knee  . Knee arthroscopy  2013    rt knee    reports that he has never smoked. He has never used smokeless tobacco. He reports that he drinks about 0.6 oz of alcohol per week. He reports that he does not use illicit drugs. family history includes Arthritis in his father and other; Breast cancer in his mother; Hypertension in his mother. There is no history of Colon cancer. Allergies  Allergen Reactions  . Penicillins       Review of Systems  Constitutional: Negative for fever and chills.  Hematological: Negative for adenopathy.       Objective:   Physical Exam  Constitutional: He appears well-developed and well-nourished.  Cardiovascular: Normal rate and regular rhythm.   Pulmonary/Chest: Effort normal and breath sounds normal. No respiratory distress. He has no wheezes. He has no rales.  Genitourinary:  No obvious inguinal hernia  Skin:  Few faint excoriations right inguinal region. He has a small nonfluctuant nodule on the anterior scrotal sac with punctate center. No pustules. No vesicle. Nontender. No surrounding erythema.          Assessment & Plan:  Pruritus- right inguinal region.   Possibly related to  recent bite. Triamcinolone 0.1% cream twice daily as needed. Avoid scratching as much as possible. No obvious hernia at this time.

## 2015-12-23 NOTE — Progress Notes (Signed)
Pre visit review using our clinic review tool, if applicable. No additional management support is needed unless otherwise documented below in the visit note. 

## 2016-04-25 ENCOUNTER — Other Ambulatory Visit (INDEPENDENT_AMBULATORY_CARE_PROVIDER_SITE_OTHER): Payer: BLUE CROSS/BLUE SHIELD

## 2016-04-25 ENCOUNTER — Other Ambulatory Visit: Payer: BLUE CROSS/BLUE SHIELD

## 2016-04-25 DIAGNOSIS — Z0000 Encounter for general adult medical examination without abnormal findings: Secondary | ICD-10-CM | POA: Diagnosis not present

## 2016-04-25 LAB — CBC WITH DIFFERENTIAL/PLATELET
BASOS PCT: 0 %
Basophils Absolute: 0 cells/uL (ref 0–200)
EOS ABS: 188 {cells}/uL (ref 15–500)
Eosinophils Relative: 4 %
HCT: 43.7 % (ref 38.5–50.0)
Hemoglobin: 14.6 g/dL (ref 13.2–17.1)
LYMPHS PCT: 22 %
Lymphs Abs: 1034 cells/uL (ref 850–3900)
MCH: 29.4 pg (ref 27.0–33.0)
MCHC: 33.4 g/dL (ref 32.0–36.0)
MCV: 87.9 fL (ref 80.0–100.0)
MONOS PCT: 12 %
MPV: 11.4 fL (ref 7.5–12.5)
Monocytes Absolute: 564 cells/uL (ref 200–950)
Neutro Abs: 2914 cells/uL (ref 1500–7800)
Neutrophils Relative %: 62 %
PLATELETS: 197 10*3/uL (ref 140–400)
RBC: 4.97 MIL/uL (ref 4.20–5.80)
RDW: 13.4 % (ref 11.0–15.0)
WBC: 4.7 10*3/uL (ref 3.8–10.8)

## 2016-04-25 LAB — BASIC METABOLIC PANEL
BUN: 22 mg/dL (ref 7–25)
CHLORIDE: 105 mmol/L (ref 98–110)
CO2: 25 mmol/L (ref 20–31)
Calcium: 9 mg/dL (ref 8.6–10.3)
Creat: 1.22 mg/dL (ref 0.70–1.33)
GLUCOSE: 90 mg/dL (ref 65–99)
Potassium: 4.1 mmol/L (ref 3.5–5.3)
Sodium: 140 mmol/L (ref 135–146)

## 2016-04-25 LAB — LIPID PANEL
Cholesterol: 130 mg/dL (ref 125–200)
HDL: 40 mg/dL (ref >=40)
LDL Cholesterol: 67 mg/dL (ref <130)
TRIGLYCERIDES: 117 mg/dL (ref <150)
Total CHOL/HDL Ratio: 3.3 Ratio (ref <=5.0)
VLDL: 23 mg/dL (ref <30)

## 2016-04-25 LAB — HEPATIC FUNCTION PANEL
ALBUMIN: 4.4 g/dL (ref 3.6–5.1)
ALT: 16 U/L (ref 9–46)
AST: 20 U/L (ref 10–35)
Alkaline Phosphatase: 70 U/L (ref 40–115)
BILIRUBIN INDIRECT: 0.5 mg/dL (ref 0.2–1.2)
Bilirubin, Direct: 0.2 mg/dL (ref <=0.2)
Total Bilirubin: 0.7 mg/dL (ref 0.2–1.2)
Total Protein: 6.6 g/dL (ref 6.1–8.1)

## 2016-04-26 LAB — TSH: TSH: 2.32 mIU/L (ref 0.40–4.50)

## 2016-04-26 LAB — PSA: PSA: 0.4 ng/mL (ref <=4.0)

## 2016-05-02 ENCOUNTER — Encounter: Payer: Self-pay | Admitting: Family Medicine

## 2016-05-02 ENCOUNTER — Ambulatory Visit (INDEPENDENT_AMBULATORY_CARE_PROVIDER_SITE_OTHER): Payer: BLUE CROSS/BLUE SHIELD | Admitting: Family Medicine

## 2016-05-02 VITALS — BP 110/60 | HR 68 | Temp 99.0°F | Ht 70.5 in | Wt 224.0 lb

## 2016-05-02 DIAGNOSIS — Z0000 Encounter for general adult medical examination without abnormal findings: Secondary | ICD-10-CM

## 2016-05-02 NOTE — Progress Notes (Signed)
Subjective:     Patient ID: Martin Beaner., male   DOB: 27-Oct-1965, 50 y.o.   MRN: [id number]  HPI Patient here for complete physical. He has history of hemorrhoids and has had previous evaluation per GI. He still has some ongoing bleeding intermittently. Has had previous colonoscopy 2 years ago. He has history of erectile dysfunction. Takes no regular medications. Tetanus up-to-date. Nonsmoker. Job is very physical but does not do any regular exercise.  Past Medical History:  Diagnosis Date  . Arthritis    osetoarthritis   . Headache(784.0)   . Hemorrhoids    Past Surgical History:  Procedure Laterality Date  . bone graft L hand  1992  . KNEE ARTHROSCOPY  2012   L knee  . KNEE ARTHROSCOPY  2013   rt knee  . TONSILLECTOMY  1974    reports that he has never smoked. He has never used smokeless tobacco. He reports that he drinks about 0.6 oz of alcohol per week . He reports that he does not use drugs. family history includes Arthritis in his father and other; Breast cancer in his mother; Hypertension in his mother. Allergies  Allergen Reactions  . Penicillins      Review of Systems  Constitutional: Negative for activity change, appetite change, fatigue, fever and unexpected weight change.  HENT: Negative for congestion, ear pain and trouble swallowing.   Eyes: Negative for pain and visual disturbance.  Respiratory: Negative for cough, shortness of breath and wheezing.   Cardiovascular: Negative for chest pain and palpitations.  Gastrointestinal: Negative for abdominal distention, abdominal pain, blood in stool, constipation, diarrhea, nausea, rectal pain and vomiting.  Genitourinary: Negative for dysuria, hematuria and testicular pain.  Musculoskeletal: Positive for arthralgias (chronic bilateral knee pain). Negative for joint swelling.  Skin: Negative for rash.  Neurological: Negative for dizziness, syncope and headaches.  Hematological: Negative for adenopathy.   Psychiatric/Behavioral: Negative for confusion and dysphoric mood.       Objective:   Physical Exam  Constitutional: He is oriented to person, place, and time. He appears well-developed and well-nourished. No distress.  HENT:  Head: Normocephalic and atraumatic.  Right Ear: External ear normal.  Left Ear: External ear normal.  Mouth/Throat: Oropharynx is clear and moist.  Eyes: Conjunctivae and EOM are normal. Pupils are equal, round, and reactive to light.  Neck: Normal range of motion. Neck supple. No thyromegaly present.  Cardiovascular: Normal rate, regular rhythm and normal heart sounds.   No murmur heard. Pulmonary/Chest: Effort normal and breath sounds normal. No respiratory distress. He has no wheezes. He has no rales.  Abdominal: Soft. Bowel sounds are normal. He exhibits no distension and no mass. There is no tenderness. There is no rebound and no guarding.  Musculoskeletal: He exhibits no edema.  Lymphadenopathy:    He has no cervical adenopathy.  Neurological: He is alert and oriented to person, place, and time. He displays normal reflexes. No cranial nerve deficit.  Skin: No rash noted.  Psychiatric: He has a normal mood and affect.       Assessment:     Physical exam. Labs reviewed with patient with no major concerns    Plan:     -He is encouraged to lose some weight -Recommendation to consider flu vaccine this fall -Tetanus up-to-date -He plans to follow-up with GI regarding recent rectal bleeding.  Known history of hemorrhoids  Eulas Post MD [facility] at [facility]

## 2016-12-01 ENCOUNTER — Encounter: Payer: Self-pay | Admitting: Gastroenterology

## 2017-02-10 ENCOUNTER — Other Ambulatory Visit: Payer: Self-pay | Admitting: Family Medicine

## 2017-03-27 ENCOUNTER — Encounter: Payer: Self-pay | Admitting: Gastroenterology

## 2017-03-27 ENCOUNTER — Telehealth: Payer: Self-pay | Admitting: Family Medicine

## 2017-03-27 NOTE — Telephone Encounter (Signed)
Pt would like dr burchette to see his daughter, she would be new patient. Dr Elease Hashimoto also sees his wife. Can I sch?

## 2017-03-27 NOTE — Telephone Encounter (Signed)
ok 

## 2017-03-29 NOTE — Telephone Encounter (Signed)
Pt daughter has been sch

## 2017-05-09 ENCOUNTER — Encounter: Payer: BLUE CROSS/BLUE SHIELD | Admitting: Family Medicine

## 2017-05-10 ENCOUNTER — Encounter: Payer: Self-pay | Admitting: Family Medicine

## 2017-05-10 ENCOUNTER — Ambulatory Visit (INDEPENDENT_AMBULATORY_CARE_PROVIDER_SITE_OTHER): Payer: BLUE CROSS/BLUE SHIELD | Admitting: Family Medicine

## 2017-05-10 VITALS — BP 122/86 | HR 74 | Temp 98.1°F | Ht 70.5 in | Wt 215.6 lb

## 2017-05-10 DIAGNOSIS — D229 Melanocytic nevi, unspecified: Secondary | ICD-10-CM

## 2017-05-10 DIAGNOSIS — Z0000 Encounter for general adult medical examination without abnormal findings: Secondary | ICD-10-CM

## 2017-05-10 DIAGNOSIS — D369 Benign neoplasm, unspecified site: Secondary | ICD-10-CM | POA: Diagnosis not present

## 2017-05-10 MED ORDER — TRIAMCINOLONE ACETONIDE 0.1 % EX CREA: 1.0000 "application " | TOPICAL_CREAM | Freq: Two times a day (BID) | CUTANEOUS | 1 refills | Status: DC | PRN

## 2017-05-10 NOTE — Progress Notes (Signed)
Subjective:     Patient ID: Martin Bennett., male   DOB: Feb 11, 1966, 51 y.o.   MRN: [id number]  HPI Patient seen for complete physical. Generally very healthy. He has lost about 9 pounds this year due to his own efforts. He has history osteoarthritis mostly involving the knees. He has started back working for [employer]. Job is fairly physical.  History of solitary adenomatous polyp back in 2013. He has scheduled repeat colonoscopy October 8. Tetanus up-to-date. Declines flu vaccine.  Patient has pruritic rash left upper chest just below the breast region. Present for a few months. Has not tried topicals.  Past Medical History:  Diagnosis Date  . Arthritis    osetoarthritis   . Headache(784.0)   . Hemorrhoids    Past Surgical History:  Procedure Laterality Date  . bone graft L hand  1992  . KNEE ARTHROSCOPY  2012   L knee  . KNEE ARTHROSCOPY  2013   rt knee  . TONSILLECTOMY  1974    reports that he has never smoked. He has never used smokeless tobacco. He reports that he drinks about 0.6 oz of alcohol per week . He reports that he does not use drugs. family history includes Arthritis in his father and other; Breast cancer in his mother; Hypertension in his mother. Allergies  Allergen Reactions  . Penicillins      Review of Systems  Constitutional: Negative for activity change, appetite change, fatigue and fever.  HENT: Negative for congestion, ear pain and trouble swallowing.   Eyes: Negative for pain and visual disturbance.  Respiratory: Negative for cough, shortness of breath and wheezing.   Cardiovascular: Negative for chest pain and palpitations.  Gastrointestinal: Negative for abdominal distention, abdominal pain, blood in stool, constipation, diarrhea, nausea, rectal pain and vomiting.  Genitourinary: Negative for dysuria, hematuria and testicular pain.  Musculoskeletal: Negative for arthralgias and joint swelling.  Skin: Positive for rash.  Neurological: Negative for  dizziness, syncope and headaches.  Hematological: Negative for adenopathy.  Psychiatric/Behavioral: Negative for confusion and dysphoric mood.       Objective:   Physical Exam  Constitutional: He is oriented to person, place, and time. He appears well-developed and well-nourished. No distress.  HENT:  Head: Normocephalic and atraumatic.  Right Ear: External ear normal.  Left Ear: External ear normal.  Mouth/Throat: Oropharynx is clear and moist.  Eyes: Pupils are equal, round, and reactive to light. Conjunctivae and EOM are normal.  Neck: Normal range of motion. Neck supple. No thyromegaly present.  Cardiovascular: Normal rate, regular rhythm and normal heart sounds.   No murmur heard. Pulmonary/Chest: No respiratory distress. He has no wheezes. He has no rales.  Abdominal: Soft. Bowel sounds are normal. He exhibits no distension and no mass. There is no tenderness. There is no rebound and no guarding.  Musculoskeletal: He exhibits no edema.  Lymphadenopathy:    He has no cervical adenopathy.  Neurological: He is alert and oriented to person, place, and time. He displays normal reflexes. No cranial nerve deficit.  Skin: Rash noted.  Patient has slightly erythematous somewhat elongated nonscaly patch of rash just below the left breast region. Nontender.  Psychiatric: He has a normal mood and affect.       Assessment:     Physical exam.  Hx of adenoma of colon with f/u colonoscopy in October.    Plan:     -Discussed new shingles vaccine and patient will check on insurance coverage -Obtain screening lab work. Patient requesting PSA -  Repeat colonoscopy has been scheduled -Set up dermatology referral regarding his multiple nevi for second opinion regarding whether any of these should be biopsied -Triamcinolone 0.1% cream to use twice daily as needed for rash left chest wall  Eulas Post MD [facility] at [facility]

## 2017-05-10 NOTE — Patient Instructions (Signed)
Consider new shingles vaccine (Shingrix)  We will set up dermatology referral.

## 2017-05-11 LAB — BASIC METABOLIC PANEL WITH GFR
BUN: 21 mg/dL (ref 7–25)
CALCIUM: 9.4 mg/dL (ref 8.6–10.3)
CHLORIDE: 103 mmol/L (ref 98–110)
CO2: 26 mmol/L (ref 20–32)
Creat: 1.26 mg/dL (ref 0.70–1.33)
GFR, EST AFRICAN AMERICAN: 76 mL/min/{1.73_m2} (ref >=60)
GFR, Est Non African American: 66 mL/min/{1.73_m2} (ref >=60)
GLUCOSE: 85 mg/dL (ref 65–99)
POTASSIUM: 4.2 mmol/L (ref 3.5–5.3)
Sodium: 138 mmol/L (ref 135–146)

## 2017-05-11 LAB — HEPATIC FUNCTION PANEL
AG Ratio: 2.1 (calc) (ref 1.0–2.5)
ALKALINE PHOSPHATASE (APISO): 78 U/L (ref 40–115)
ALT: 17 U/L (ref 9–46)
AST: 21 U/L (ref 10–35)
Albumin: 4.6 g/dL (ref 3.6–5.1)
Bilirubin, Direct: 0.1 mg/dL (ref 0.0–0.2)
Globulin: 2.2 g/dL (calc) (ref 1.9–3.7)
Indirect Bilirubin: 0.7 mg/dL (calc) (ref 0.2–1.2)
TOTAL PROTEIN: 6.8 g/dL (ref 6.1–8.1)
Total Bilirubin: 0.8 mg/dL (ref 0.2–1.2)

## 2017-05-11 LAB — CBC WITH DIFFERENTIAL/PLATELET
BASOS ABS: 59 {cells}/uL (ref 0–200)
Basophils Relative: 0.8 %
EOS ABS: 200 {cells}/uL (ref 15–500)
Eosinophils Relative: 2.7 %
HCT: 46 % (ref 38.5–50.0)
Hemoglobin: 16.1 g/dL (ref 13.2–17.1)
Lymphs Abs: 1413 cells/uL (ref 850–3900)
MCH: 30.2 pg (ref 27.0–33.0)
MCHC: 35 g/dL (ref 32.0–36.0)
MCV: 86.3 fL (ref 80.0–100.0)
MPV: 11.3 fL (ref 7.5–12.5)
Monocytes Relative: 9.1 %
Neutro Abs: 5054 cells/uL (ref 1500–7800)
Neutrophils Relative %: 68.3 %
Platelets: 242 10*3/uL (ref 140–400)
RBC: 5.33 10*6/uL (ref 4.20–5.80)
RDW: 12.5 % (ref 11.0–15.0)
Total Lymphocyte: 19.1 %
WBC mixed population: 673 cells/uL (ref 200–950)
WBC: 7.4 10*3/uL (ref 3.8–10.8)

## 2017-05-11 LAB — TSH: TSH: 1.95 mIU/L (ref 0.40–4.50)

## 2017-05-11 LAB — LIPID PANEL
Cholesterol: 177 mg/dL (ref <200)
HDL: 51 mg/dL (ref >40)
LDL Cholesterol (Calc): 105 mg/dL (calc) — ABNORMAL HIGH
Non-HDL Cholesterol (Calc): 126 mg/dL (calc) (ref <130)
TRIGLYCERIDES: 116 mg/dL (ref <150)
Total CHOL/HDL Ratio: 3.5 (calc) (ref <5.0)

## 2017-05-11 LAB — PSA: PSA: 0.4 ng/mL (ref <=4.0)

## 2017-05-26 ENCOUNTER — Encounter: Payer: BLUE CROSS/BLUE SHIELD | Admitting: Gastroenterology

## 2017-05-30 ENCOUNTER — Telehealth: Payer: Self-pay | Admitting: Family Medicine

## 2017-05-30 NOTE — Telephone Encounter (Signed)
° ° ° °  Pt is having some dizzyness when he wake up in the morning. Ask for a call back

## 2017-05-30 NOTE — Telephone Encounter (Signed)
Spoke with patient and he will check his blood pressure daily and schedule an appointment if symptoms persist. An appointment was offered but the patient declined at this time.  fyi

## 2017-06-12 ENCOUNTER — Encounter: Payer: BLUE CROSS/BLUE SHIELD | Admitting: Gastroenterology

## 2017-10-12 ENCOUNTER — Other Ambulatory Visit: Payer: Self-pay

## 2017-10-12 ENCOUNTER — Ambulatory Visit (AMBULATORY_SURGERY_CENTER): Payer: Self-pay | Admitting: *Deleted

## 2017-10-12 VITALS — Ht 71.0 in | Wt 210.0 lb

## 2017-10-12 DIAGNOSIS — Z86010 Personal history of colonic polyps: Secondary | ICD-10-CM

## 2017-10-12 MED ORDER — PEG 3350-KCL-NA BICARB-NACL 420 G PO SOLR: 4000.0000 mL | Freq: Once | ORAL | 0 refills | Status: AC

## 2017-10-12 NOTE — Progress Notes (Signed)
No egg or soy allergy known to patient  No issues with past sedation with any surgeries  or procedures, no intubation problems  No diet pills per patient No home 02 use per patient  No blood thinners per patient  Pt denies issues with constipation  No A fib or A flutter  EMMI video sent to pt's e mail pt declined   

## 2017-10-18 ENCOUNTER — Ambulatory Visit (AMBULATORY_SURGERY_CENTER): Payer: BLUE CROSS/BLUE SHIELD | Admitting: Gastroenterology

## 2017-10-18 ENCOUNTER — Encounter: Payer: Self-pay | Admitting: Gastroenterology

## 2017-10-18 ENCOUNTER — Other Ambulatory Visit: Payer: Self-pay

## 2017-10-18 VITALS — BP 115/78 | HR 60 | Temp 98.4°F | Resp 18 | Ht 71.0 in | Wt 210.0 lb

## 2017-10-18 DIAGNOSIS — Z860100 Personal history of colon polyps, unspecified: Secondary | ICD-10-CM

## 2017-10-18 DIAGNOSIS — D123 Benign neoplasm of transverse colon: Secondary | ICD-10-CM | POA: Diagnosis not present

## 2017-10-18 DIAGNOSIS — K649 Unspecified hemorrhoids: Secondary | ICD-10-CM

## 2017-10-18 MED ORDER — SODIUM CHLORIDE 0.9 % IV SOLN: 500.0000 mL | Freq: Once | INTRAVENOUS | Status: DC

## 2017-10-18 NOTE — Progress Notes (Signed)
A/ox3 pleased with MAC, report to Donna RN 

## 2017-10-18 NOTE — Progress Notes (Signed)
Pt's states no medical or surgical changes since previsit or office visit. 

## 2017-10-18 NOTE — Op Note (Signed)
[facility] Patient Name: Martin Bennett Procedure Date: 10/18/2017 9:30 AM MRN: [id number] Endoscopist: Milus Banister , MD Age: 52 Referring MD:  Date of Birth: 03/09/1966 Gender: Male Account #: [id number] Procedure:                Colonoscopy Indications:              High risk colon cancer surveillance: Personal                            history of colonic polyps Adenomatous polyp:                            colonoscopy Dr. Ardis Hughs 01/2012 single small polyp,                            recall at 5 years Medicines:                Monitored Anesthesia Care Procedure:                Pre-Anesthesia Assessment:                           - Prior to the procedure, a History and Physical                            was performed, and patient medications and                            allergies were reviewed. The patient's tolerance of                            previous anesthesia was also reviewed. The risks                            and benefits of the procedure and the sedation                            options and risks were discussed with the patient.                            All questions were answered, and informed consent                            was obtained. Prior Anticoagulants: The patient has                            taken no previous anticoagulant or antiplatelet                            agents. ASA Grade Assessment: II - A patient with                            mild systemic disease. After reviewing the risks  and benefits, the patient was deemed in                            satisfactory condition to undergo the procedure.                           After obtaining informed consent, the colonoscope                            was passed under direct vision. Throughout the                            procedure, the patient's blood pressure, pulse, and                            oxygen saturations were monitored continuously. The                             Colonoscope was introduced through the anus and                            advanced to the the cecum, identified by                            appendiceal orifice and ileocecal valve. The                            colonoscopy was performed without difficulty. The                            patient tolerated the procedure well. The quality                            of the bowel preparation was good. The ileocecal                            valve, appendiceal orifice, and rectum were                            photographed. Scope In: 9:39:20 AM Scope Out: 9:51:28 AM Scope Withdrawal Time: 0 hours 9 minutes 59 seconds  Total Procedure Duration: 0 hours 12 minutes 8 seconds  Findings:                 A 4 mm polyp was found in the transverse colon. The                            polyp was sessile. The polyp was removed with a                            cold snare. Resection and retrieval were complete.                           External and internal hemorrhoids were found. The  hemorrhoids were medium-sized.                           The exam was otherwise without abnormality on                            direct and retroflexion views. Complications:            No immediate complications. Estimated blood loss:                            None. Estimated Blood Loss:     Estimated blood loss: none. Impression:               - One 4 mm polyp in the transverse colon, removed                            with a cold snare. Resected and retrieved.                           - External and internal hemorrhoids.                           - The examination was otherwise normal on direct                            and retroflexion views. Recommendation:           - Patient has a contact number available for                            emergencies. The signs and symptoms of potential                            delayed complications were discussed with  the                            patient. Return to normal activities tomorrow.                            Written discharge instructions were provided to the                            patient.                           - Resume previous diet.                           - Continue present medications.                           - Consider referral for in-office hemorrhoid                            banding procedure for your mixed internal/external  hemorrhoids.                           You will receive a letter within 2-3 weeks with the                            pathology results and my final recommendations.                           If the polyp(s) is proven to be 'pre-cancerous' on                            pathology, you will need repeat colonoscopy in 5                            years. If the polyp(s) is NOT 'precancerous' on                            pathology then you should repeat colon cancer                            screening in 10 years with colonoscopy without need                            for colon cancer screening by any method prior to                            then (including stool testing). Milus Banister, MD 10/18/2017 9:55:05 AM This report has been signed electronically.

## 2017-10-18 NOTE — Progress Notes (Signed)
Called to room to assist during endoscopic procedure.  Patient ID and intended procedure confirmed with present staff. Received instructions for my participation in the procedure from the performing physician.  

## 2017-10-18 NOTE — Patient Instructions (Signed)
YOU HAD AN ENDOSCOPIC PROCEDURE TODAY AT [facility]:   Refer to the procedure report that was given to you for any specific questions about what was found during the examination.  If the procedure report does not answer your questions, please call your gastroenterologist to clarify.  If you requested that your care partner not be given the details of your procedure findings, then the procedure report has been included in a sealed envelope for you to review at your convenience later.  YOU SHOULD EXPECT: Some feelings of bloating in the abdomen. Passage of more gas than usual.  Walking can help get rid of the air that was put into your GI tract during the procedure and reduce the bloating. If you had a lower endoscopy (such as a colonoscopy or flexible sigmoidoscopy) you may notice spotting of blood in your stool or on the toilet paper. If you underwent a bowel prep for your procedure, you may not have a normal bowel movement for a few days.  Please Note:  You might notice some irritation and congestion in your nose or some drainage.  This is from the oxygen used during your procedure.  There is no need for concern and it should clear up in a day or so.  SYMPTOMS TO REPORT IMMEDIATELY:   Following lower endoscopy (colonoscopy or flexible sigmoidoscopy):  Excessive amounts of blood in the stool  Significant tenderness or worsening of abdominal pains  Swelling of the abdomen that is new, acute  Fever of 100F or higher  Please see handouts on hemorrhoids and polyps.  For urgent or emergent issues, a gastroenterologist can be reached at any hour by calling [phone].   DIET:  We do recommend a small meal at first, but then you may proceed to your regular diet.  Drink plenty of fluids but you should avoid alcoholic beverages for 24 hours.  ACTIVITY:  You should plan to take it easy for the rest of today and you should NOT DRIVE or use heavy machinery until tomorrow (because of  the sedation medicines used during the test).    FOLLOW UP: Our staff will call the number listed on your records the next business day following your procedure to check on you and address any questions or concerns that you may have regarding the information given to you following your procedure. If we do not reach you, we will leave a message.  However, if you are feeling well and you are not experiencing any problems, there is no need to return our call.  We will assume that you have returned to your regular daily activities without incident.  If any biopsies were taken you will be contacted by phone or by letter within the next 1-3 weeks.  Please call us at [phone] if you have not heard about the biopsies in 3 weeks.    SIGNATURES/CONFIDENTIALITY: You and/or your care partner have signed paperwork which will be entered into your electronic medical record.  These signatures attest to the fact that that the information above on your After Visit Summary has been reviewed and is understood.  Full responsibility of the confidentiality of this discharge information lies with you and/or your care-partner.   Thank you for allowing [place] to provide your healthcare today.

## 2017-10-19 ENCOUNTER — Telehealth: Payer: Self-pay

## 2017-10-19 ENCOUNTER — Telehealth: Payer: Self-pay | Admitting: *Deleted

## 2017-10-19 NOTE — Telephone Encounter (Signed)
  Follow up Call-  Call back number 10/18/2017  Post procedure Call Back phone  # [phone]  Permission to leave phone message Yes  comments if voicemail set up  Some recent data might be hidden    Pt's voicemail said he was not available- didn't say if voicemail was set up or not, but did beep.  Message left and will attempt to reach later today.

## 2017-10-19 NOTE — Telephone Encounter (Signed)
  Follow up Call-  Call back number 10/18/2017  Post procedure Call Back phone  # [phone]  Permission to leave phone message Yes  comments if voicemail set up  Some recent data might be hidden     Patient questions:  Do you have a fever, pain , or abdominal swelling? No. Pain Score  0 *  Have you tolerated food without any problems? Yes.    Have you been able to return to your normal activities? Yes.    Do you have any questions about your discharge instructions: Diet   No. Medications  No. Follow up visit  No.  Do you have questions or concerns about your Care? No.  Actions: * If pain score is 4 or above: No action needed, pain <4.

## 2017-10-24 ENCOUNTER — Encounter: Payer: Self-pay | Admitting: Gastroenterology

## 2017-11-20 ENCOUNTER — Ambulatory Visit: Payer: BLUE CROSS/BLUE SHIELD | Admitting: Family Medicine

## 2017-11-20 ENCOUNTER — Encounter: Payer: Self-pay | Admitting: Family Medicine

## 2017-11-20 VITALS — BP 110/70 | HR 67 | Temp 98.2°F | Wt 217.2 lb

## 2017-11-20 DIAGNOSIS — R42 Dizziness and giddiness: Secondary | ICD-10-CM | POA: Diagnosis not present

## 2017-11-20 DIAGNOSIS — L659 Nonscarring hair loss, unspecified: Secondary | ICD-10-CM | POA: Diagnosis not present

## 2017-11-20 DIAGNOSIS — H8110 Benign paroxysmal vertigo, unspecified ear: Secondary | ICD-10-CM | POA: Diagnosis not present

## 2017-11-20 LAB — TSH: TSH: 2.67 mIU/L (ref 0.40–4.50)

## 2017-11-20 NOTE — Progress Notes (Signed)
Subjective:     Patient ID: Martin Bennett., male   DOB: 04-28-1966, 52 y.o.   MRN: [id number]  HPI Patient seen for the following issues  He's had some patchy hair loss since around January. 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 He had normal TSH back in September. No family history of hair loss. Denies any change of hair care products. Has not noted any itching or scalp rash.  Other issues he has had couple of episodes of dizziness since he was here last. Back in November he had episode of vertigo one morning in bed. The seem to be directional and worse early mornings and with movement. After few days his symptoms resolved. Just last week he was working on his car and when he got up noticed some lightheadedness. This is much different than prior vertigo. No syncope. No chest pains. No dizziness since then. Generally hydrates very well.  Past Medical History:  Diagnosis Date  . Allergy   . Arthritis    osetoarthritis   . Headache(784.0)   . Hemorrhoids   . Macular degeneration    beginnings 10-12-17  . Tubular adenoma of colon    Past Surgical History:  Procedure Laterality Date  . bone graft L hand  1992  . COLONOSCOPY    . KNEE ARTHROSCOPY  2012   L knee  . KNEE ARTHROSCOPY  2013   rt knee  . POLYPECTOMY    . TONSILLECTOMY  1974    reports that  has never smoked. he has never used smokeless tobacco. He reports that he drinks about 0.6 oz of alcohol per week. He reports that he does not use drugs. family history includes Arthritis in his father and other; Breast cancer in his mother; Hypertension in his mother. Allergies  Allergen Reactions  . Penicillins      Review of Systems  Constitutional: Negative for fatigue.  Eyes: Negative for visual disturbance.  Respiratory: Negative for cough, chest tightness and shortness of breath.   Cardiovascular: Negative for chest pain,  palpitations and leg swelling.  Genitourinary: Negative for dysuria.  Neurological: Positive for dizziness and light-headedness. Negative for syncope, weakness and headaches.       Objective:   Physical Exam  Constitutional: He appears well-developed and well-nourished.  Neck: Neck supple. No thyromegaly present.  Cardiovascular: Normal rate and regular rhythm. Exam reveals no gallop.  No murmur heard. Pulmonary/Chest: Effort normal and breath sounds normal.  Musculoskeletal: He exhibits no edema.  Lymphadenopathy:    He has no cervical adenopathy.  Skin:  Patient has some diffuse patches of hair loss and thinning involving occipital parietal and temporal scalp. This is somewhat bilateral. Nonscaly.       Assessment:     #1 patient has patchy hair loss. This is different from his receding hairline which he's had for years. He does not have any scaling to suggest tenia capitis  #2 episodic dizziness. He has what sounds like episode of benign peripheral positional vertigo back in January. More recent episodes sounded more like lightheadedness. No syncope    Plan:     -Repeat TSH -Consider dermatology referral regarding his alopecia -Explained pathophysiology of vertigo and especially benign peripheral positional vertigo. Follow-up for any recurrence -Consider Epley maneuvers for recurrent vertigo episodes  Eulas Post MD [facility] at [facility]

## 2017-11-23 ENCOUNTER — Telehealth: Payer: Self-pay | Admitting: *Deleted

## 2017-11-23 NOTE — Telephone Encounter (Signed)
Copied from [place] [id number] [id number]. Topic: Inquiry >> Nov 23, 2017 10:32 AM Pricilla Handler wrote: Patient has scheduled an appt with [facility] instead of [facility]. He was able to get in quicker with [facility]. Patient wants Dr. Elease Hashimoto to know.         Thank You!!!

## 2017-11-24 NOTE — Telephone Encounter (Signed)
Noted  

## 2018-04-14 ENCOUNTER — Other Ambulatory Visit: Payer: Self-pay | Admitting: Family Medicine

## 2018-10-30 ENCOUNTER — Encounter: Payer: Self-pay | Admitting: Family Medicine

## 2018-10-30 ENCOUNTER — Ambulatory Visit (INDEPENDENT_AMBULATORY_CARE_PROVIDER_SITE_OTHER): Payer: BLUE CROSS/BLUE SHIELD | Admitting: Family Medicine

## 2018-10-30 ENCOUNTER — Other Ambulatory Visit: Payer: Self-pay

## 2018-10-30 VITALS — BP 120/80 | HR 67 | Temp 97.7°F | Ht 70.25 in | Wt 218.0 lb

## 2018-10-30 DIAGNOSIS — Z0000 Encounter for general adult medical examination without abnormal findings: Secondary | ICD-10-CM

## 2018-10-30 DIAGNOSIS — Z23 Encounter for immunization: Secondary | ICD-10-CM | POA: Diagnosis not present

## 2018-10-30 NOTE — Patient Instructions (Signed)
Consider shingles vaccine (Shingrix) and check on insurance coverage if interested.   

## 2018-10-30 NOTE — Addendum Note (Signed)
Addended by: Gwynne Edinger on: 10/30/2018 12:16 PM   Modules accepted: Orders

## 2018-10-30 NOTE — Progress Notes (Signed)
Subjective:     Patient ID: Martin Bennett., male   DOB: December 29, 1965, 53 y.o.   MRN: [id number]  HPI Patient is seen for physical exam.  Generally very healthy.  He takes no regular medications.  He has history of erectile dysfunction and has taken Cialis in the past but has not seen great success.  He is a non-smoker.  No history of peripheral vascular disease.  Health maintenance reviewed  -He declines flu vaccines -Last tetanus 2009 -Colonoscopy 10/18/2017 -no hx of shingles vaccine  Family history reviewed with no changes.  His father is 51 and still works full-time.  Past Medical History:  Diagnosis Date  . Allergy   . Arthritis    osetoarthritis   . Headache(784.0)   . Hemorrhoids   . Macular degeneration    beginnings 10-12-17  . Tubular adenoma of colon    Past Surgical History:  Procedure Laterality Date  . bone graft L hand  1992  . COLONOSCOPY    . KNEE ARTHROSCOPY  2012   L knee  . KNEE ARTHROSCOPY  2013   rt knee  . POLYPECTOMY    . TONSILLECTOMY  1974    reports that he has never smoked. He has never used smokeless tobacco. He reports current alcohol use of about 1.0 standard drinks of alcohol per week. He reports that he does not use drugs. family history includes Arthritis in his father and another family member; Breast cancer in his mother; Hypertension in his mother. Allergies  Allergen Reactions  . Penicillins      Review of Systems  Constitutional: Negative for activity change, appetite change, fatigue and fever.  HENT: Negative for congestion, ear pain and trouble swallowing.   Eyes: Negative for pain and visual disturbance.  Respiratory: Negative for cough, shortness of breath and wheezing.   Cardiovascular: Negative for chest pain and palpitations.  Gastrointestinal: Negative for abdominal distention, abdominal pain, blood in stool, constipation, diarrhea, nausea, rectal pain and vomiting.  Endocrine: Negative for polydipsia and polyuria.   Genitourinary: Negative for dysuria, hematuria and testicular pain.  Musculoskeletal: Negative for arthralgias and joint swelling.  Skin: Negative for rash.  Neurological: Negative for dizziness, syncope and headaches.  Hematological: Negative for adenopathy.  Psychiatric/Behavioral: Negative for confusion and dysphoric mood.       Objective:   Physical Exam Constitutional:      General: He is not in acute distress.    Appearance: He is well-developed.  HENT:     Head: Normocephalic and atraumatic.     Right Ear: External ear normal.     Left Ear: External ear normal.  Eyes:     Conjunctiva/sclera: Conjunctivae normal.     Pupils: Pupils are equal, round, and reactive to light.  Neck:     Musculoskeletal: Normal range of motion and neck supple.     Thyroid: No thyromegaly.  Cardiovascular:     Rate and Rhythm: Normal rate and regular rhythm.     Heart sounds: Normal heart sounds. No murmur.  Pulmonary:     Effort: No respiratory distress.     Breath sounds: No wheezing or rales.  Abdominal:     General: Bowel sounds are normal. There is no distension.     Palpations: Abdomen is soft. There is no mass.     Tenderness: There is no abdominal tenderness. There is no guarding or rebound.  Lymphadenopathy:     Cervical: No cervical adenopathy.  Skin:    Findings: No rash.  Neurological:     Mental Status: He is alert and oriented to person, place, and time.     Cranial Nerves: No cranial nerve deficit.     Deep Tendon Reflexes: Reflexes normal.        Assessment:     Physical exam.  Patient has no significant active medical problems.  We discussed the following health maintenance issues    Plan:     -Discussed Shingrix vaccine and at this point he does not wish to pursue that -Offered flu vaccine but he declines -Tetanus booster given -Obtain screening lab work -Colonoscopy up-to-date as above  Eulas Post MD [facility] at [facility]

## 2018-10-31 LAB — CBC WITH DIFFERENTIAL/PLATELET
Absolute Monocytes: 559 cells/uL (ref 200–950)
Basophils Absolute: 40 cells/uL (ref 0–200)
Basophils Relative: 0.7 %
Eosinophils Absolute: 291 cells/uL (ref 15–500)
Eosinophils Relative: 5.1 %
HCT: 47.1 % (ref 38.5–50.0)
Hemoglobin: 16.2 g/dL (ref 13.2–17.1)
Lymphs Abs: 1362 cells/uL (ref 850–3900)
MCH: 30 pg (ref 27.0–33.0)
MCHC: 34.4 g/dL (ref 32.0–36.0)
MCV: 87.2 fL (ref 80.0–100.0)
MPV: 11.7 fL (ref 7.5–12.5)
Monocytes Relative: 9.8 %
Neutro Abs: 3449 cells/uL (ref 1500–7800)
Neutrophils Relative %: 60.5 %
Platelets: 220 10*3/uL (ref 140–400)
RBC: 5.4 10*6/uL (ref 4.20–5.80)
RDW: 12.4 % (ref 11.0–15.0)
TOTAL LYMPHOCYTE: 23.9 %
WBC: 5.7 10*3/uL (ref 3.8–10.8)

## 2018-10-31 LAB — LIPID PANEL
CHOL/HDL RATIO: 3.5 (calc) (ref <5.0)
Cholesterol: 171 mg/dL (ref <200)
HDL: 49 mg/dL (ref >=40)
LDL Cholesterol (Calc): 102 mg/dL (calc) — ABNORMAL HIGH
Non-HDL Cholesterol (Calc): 122 mg/dL (calc) (ref <130)
Triglycerides: 100 mg/dL (ref <150)

## 2018-10-31 LAB — PSA: PSA: 0.4 ng/mL (ref <=4.0)

## 2018-10-31 LAB — BASIC METABOLIC PANEL
BUN: 14 mg/dL (ref 7–25)
CO2: 26 mmol/L (ref 20–32)
Calcium: 9.5 mg/dL (ref 8.6–10.3)
Chloride: 105 mmol/L (ref 98–110)
Creat: 1.03 mg/dL (ref 0.70–1.33)
Glucose, Bld: 87 mg/dL (ref 65–99)
Potassium: 4.4 mmol/L (ref 3.5–5.3)
Sodium: 141 mmol/L (ref 135–146)

## 2018-10-31 LAB — HEPATIC FUNCTION PANEL
AG Ratio: 2.2 (calc) (ref 1.0–2.5)
ALBUMIN MSPROF: 4.7 g/dL (ref 3.6–5.1)
ALT: 16 U/L (ref 9–46)
AST: 20 U/L (ref 10–35)
Alkaline phosphatase (APISO): 81 U/L (ref 35–144)
BILIRUBIN DIRECT: 0.1 mg/dL (ref 0.0–0.2)
BILIRUBIN INDIRECT: 0.5 mg/dL (ref 0.2–1.2)
GLOBULIN: 2.1 g/dL (ref 1.9–3.7)
Total Bilirubin: 0.6 mg/dL (ref 0.2–1.2)
Total Protein: 6.8 g/dL (ref 6.1–8.1)

## 2018-10-31 LAB — TSH: TSH: 1.89 mIU/L (ref 0.40–4.50)

## 2019-05-04 ENCOUNTER — Other Ambulatory Visit: Payer: Self-pay | Admitting: Family Medicine

## 2019-07-12 ENCOUNTER — Telehealth: Payer: Self-pay

## 2019-07-12 ENCOUNTER — Other Ambulatory Visit: Payer: Self-pay | Admitting: Family Medicine

## 2019-07-12 MED ORDER — SCOPOLAMINE 1 MG/3DAYS TD PT72: 1.0000 | MEDICATED_PATCH | TRANSDERMAL | 1 refills | Status: DC

## 2019-07-12 NOTE — Telephone Encounter (Signed)
I don't see that he has had anything for this in the past? I like the scopolamine patches; has he tried these ever?   Let me know what he is thinking. If he hasn't tried something in past, I recommend the patches which go just behind the ear; put them on day before and they work really well.

## 2019-07-12 NOTE — Telephone Encounter (Signed)
Copied from [place] [id number]. Topic: General - Inquiry >> Jul 12, 2019  4:03 PM Mathis Bud wrote: Reason for CRM: Patient states he is going deep sea fishing and would like a medication for that due to getting sea sick. Call back [phone] PHARMACY [id number] - [place], [place] - [phone] (Phone) [phone] (Fax)

## 2019-07-12 NOTE — Telephone Encounter (Signed)
Can Dr. Jerilee Hoh assist since Dr. Elease Hashimoto is out of the office?

## 2019-07-13 NOTE — Telephone Encounter (Signed)
Spoke with patient and Dr Ethlyn Gallery has addressed his prescription.

## 2019-12-05 ENCOUNTER — Other Ambulatory Visit: Payer: Self-pay

## 2019-12-05 ENCOUNTER — Other Ambulatory Visit: Payer: Self-pay | Admitting: Family Medicine

## 2019-12-05 ENCOUNTER — Ambulatory Visit: Payer: Self-pay

## 2019-12-05 DIAGNOSIS — G8929 Other chronic pain: Secondary | ICD-10-CM

## 2019-12-05 DIAGNOSIS — M25561 Pain in right knee: Secondary | ICD-10-CM

## 2020-02-05 ENCOUNTER — Other Ambulatory Visit: Payer: Self-pay

## 2020-02-05 ENCOUNTER — Encounter: Payer: Self-pay | Admitting: Family Medicine

## 2020-02-05 ENCOUNTER — Ambulatory Visit (INDEPENDENT_AMBULATORY_CARE_PROVIDER_SITE_OTHER): Payer: Self-pay | Admitting: Family Medicine

## 2020-02-05 VITALS — BP 124/70 | HR 72 | Temp 97.7°F | Ht 70.0 in | Wt 233.3 lb

## 2020-02-05 DIAGNOSIS — Z01818 Encounter for other preprocedural examination: Secondary | ICD-10-CM

## 2020-02-05 DIAGNOSIS — Z0000 Encounter for general adult medical examination without abnormal findings: Secondary | ICD-10-CM

## 2020-02-05 DIAGNOSIS — Z125 Encounter for screening for malignant neoplasm of prostate: Secondary | ICD-10-CM

## 2020-02-05 LAB — CBC WITH DIFFERENTIAL/PLATELET
Basophils Absolute: 0 10*3/uL (ref 0.0–0.1)
Basophils Relative: 0.8 % (ref 0.0–3.0)
Eosinophils Absolute: 0.2 10*3/uL (ref 0.0–0.7)
Eosinophils Relative: 3.8 % (ref 0.0–5.0)
HCT: 43.2 % (ref 39.0–52.0)
Hemoglobin: 15 g/dL (ref 13.0–17.0)
Lymphocytes Relative: 22.6 % (ref 12.0–46.0)
Lymphs Abs: 1.3 10*3/uL (ref 0.7–4.0)
MCHC: 34.8 g/dL (ref 30.0–36.0)
MCV: 88.5 fl (ref 78.0–100.0)
Monocytes Absolute: 0.7 10*3/uL (ref 0.1–1.0)
Monocytes Relative: 11.5 % (ref 3.0–12.0)
Neutro Abs: 3.6 10*3/uL (ref 1.4–7.7)
Neutrophils Relative %: 61.3 % (ref 43.0–77.0)
Platelets: 199 10*3/uL (ref 150.0–400.0)
RBC: 4.88 Mil/uL (ref 4.22–5.81)
RDW: 13.4 % (ref 11.5–15.5)
WBC: 5.9 10*3/uL (ref 4.0–10.5)

## 2020-02-05 LAB — COMPREHENSIVE METABOLIC PANEL
ALT: 15 U/L (ref 0–53)
AST: 14 U/L (ref 0–37)
Albumin: 4.4 g/dL (ref 3.5–5.2)
Alkaline Phosphatase: 78 U/L (ref 39–117)
BUN: 14 mg/dL (ref 6–23)
CO2: 30 mEq/L (ref 19–32)
Calcium: 9.2 mg/dL (ref 8.4–10.5)
Chloride: 103 mEq/L (ref 96–112)
Creatinine, Ser: 1.04 mg/dL (ref 0.40–1.50)
GFR: 74.39 mL/min (ref >60.00)
Glucose, Bld: 84 mg/dL (ref 70–99)
Potassium: 4.6 mEq/L (ref 3.5–5.1)
Sodium: 138 mEq/L (ref 135–145)
Total Bilirubin: 0.6 mg/dL (ref 0.2–1.2)
Total Protein: 6.5 g/dL (ref 6.0–8.3)

## 2020-02-05 LAB — LIPID PANEL
Cholesterol: 182 mg/dL (ref 0–200)
HDL: 46 mg/dL (ref >39.00)
NonHDL: 136.23
Total CHOL/HDL Ratio: 4
Triglycerides: 204 mg/dL — ABNORMAL HIGH (ref 0.0–149.0)
VLDL: 40.8 mg/dL — ABNORMAL HIGH (ref 0.0–40.0)

## 2020-02-05 LAB — HEMOGLOBIN A1C: Hgb A1c MFr Bld: 4.9 % (ref 4.6–6.5)

## 2020-02-05 LAB — PSA: PSA: 0.39 ng/mL (ref 0.10–4.00)

## 2020-02-05 LAB — LDL CHOLESTEROL, DIRECT: Direct LDL: 101 mg/dL

## 2020-02-05 NOTE — Progress Notes (Signed)
Subjective:     Patient ID: Martin Bennett., male   DOB: 02/16/1966, 54 y.o.   MRN: [id number]  HPI Martin Bennett is seen for physical exam.  He has upcoming surgery for right knee meniscus injury and anticipated partial knee replacement.  He has had great limitations on activity over the past several months because of his knee and has gained about 15 pounds.  He hopes to lose his weight after he gets through his surgery.  Tetanus due 2030.  Colonoscopy due 2029.  He declines Covid vaccine.  Does not get flu vaccinations.  No history of shingles vaccine.  Currently takes no regular medications.  He has no cardiac history.  No chronic lung issues.  Non-smoker.  No anticoagulant or antiplatelet therapy  Past Medical History:  Diagnosis Date  . Allergy   . Arthritis    osetoarthritis   . Headache(784.0)   . Hemorrhoids   . Macular degeneration    beginnings 10-12-17  . Tubular adenoma of colon    Past Surgical History:  Procedure Laterality Date  . bone graft L hand  1992  . COLONOSCOPY    . KNEE ARTHROSCOPY  2012   L knee  . KNEE ARTHROSCOPY  2013   rt knee  . POLYPECTOMY    . TONSILLECTOMY  1974    reports that he has never smoked. He has never used smokeless tobacco. He reports current alcohol use of about 1.0 standard drinks of alcohol per week. He reports that he does not use drugs. family history includes Arthritis in his father and another family member; Breast cancer in his mother; Hypertension in his mother. Allergies  Allergen Reactions  . Penicillins      Review of Systems  Constitutional: Negative for activity change, appetite change, fatigue and fever.  HENT: Negative for congestion, ear pain and trouble swallowing.   Eyes: Negative for pain and visual disturbance.  Respiratory: Negative for cough, shortness of breath and wheezing.   Cardiovascular: Negative for chest pain and palpitations.  Gastrointestinal: Negative for abdominal distention, abdominal pain, blood  in stool, constipation, diarrhea, nausea, rectal pain and vomiting.  Endocrine: Negative for polydipsia and polyuria.  Genitourinary: Negative for dysuria, hematuria and testicular pain.  Musculoskeletal: Negative for arthralgias and joint swelling.  Skin: Negative for rash.  Neurological: Negative for dizziness, syncope and headaches.  Hematological: Negative for adenopathy.  Psychiatric/Behavioral: Negative for confusion and dysphoric mood.       Objective:   Physical Exam Vitals reviewed.  Constitutional:      General: He is not in acute distress.    Appearance: He is well-developed.  HENT:     Head: Normocephalic and atraumatic.     Right Ear: External ear normal.     Left Ear: External ear normal.  Eyes:     Conjunctiva/sclera: Conjunctivae normal.     Pupils: Pupils are equal, round, and reactive to light.  Neck:     Thyroid: No thyromegaly.  Cardiovascular:     Rate and Rhythm: Normal rate and regular rhythm.     Heart sounds: Normal heart sounds. No murmur.  Pulmonary:     Effort: No respiratory distress.     Breath sounds: No wheezing or rales.  Abdominal:     General: Bowel sounds are normal. There is no distension.     Palpations: Abdomen is soft. There is no mass.     Tenderness: There is no abdominal tenderness. There is no guarding or rebound.  Musculoskeletal:  Cervical back: Normal range of motion and neck supple.  Lymphadenopathy:     Cervical: No cervical adenopathy.  Skin:    Findings: No rash.  Neurological:     Mental Status: He is alert and oriented to person, place, and time.     Cranial Nerves: No cranial nerve deficit.     Deep Tendon Reflexes: Reflexes normal.  Psychiatric:        Mood and Affect: Mood normal.        Thought Content: Thought content normal.        Assessment:     Physical exam.  Generally healthy 54 year old male.  In process of getting scheduled for right partial knee replacement.  No known cardiac or medical  contraindications for surgery.    Plan:     -Obtain labs including CBC, comprehensive metabolic panel, [id number], lipid panel, PSA  -Encouraged to lose weight and hopefully he can engage in more consistent exercise after his surgery and recovery  -Discussed shingles vaccine he will consider after he gets through his surgery.  We did discuss Covid vaccine briefly and he declines  Eulas Post MD [facility] at [facility]

## 2020-03-30 HISTORY — PX: REPLACEMENT TOTAL KNEE: SUR1224

## 2020-04-07 DIAGNOSIS — Z96651 Presence of right artificial knee joint: Secondary | ICD-10-CM | POA: Insufficient documentation

## 2020-05-06 ENCOUNTER — Encounter: Payer: Self-pay | Admitting: Family Medicine

## 2020-05-06 ENCOUNTER — Other Ambulatory Visit: Payer: Self-pay

## 2020-05-06 ENCOUNTER — Ambulatory Visit: Payer: BC Managed Care – PPO | Admitting: Family Medicine

## 2020-05-06 VITALS — BP 120/78 | HR 79 | Temp 98.2°F | Ht 70.0 in | Wt 235.0 lb

## 2020-05-06 DIAGNOSIS — H8112 Benign paroxysmal vertigo, left ear: Secondary | ICD-10-CM

## 2020-05-06 NOTE — Patient Instructions (Signed)
Benign Positional Vertigo Vertigo is the feeling that you or your surroundings are moving when they are not. Benign positional vertigo is the most common form of vertigo. This is usually a harmless condition (benign). This condition is positional. This means that symptoms are triggered by certain movements and positions. This condition can be dangerous if it occurs while you are doing something that could cause harm to you or others. This includes activities such as driving or operating machinery. What are the causes? In many cases, the cause of this condition is not known. It may be caused by a disturbance in an area of the inner ear that helps your brain to sense movement and balance. This disturbance can be caused by:  Viral infection (labyrinthitis).  Head injury.  Repetitive motion, such as jumping, dancing, or running. What increases the risk? You are more likely to develop this condition if:  You are a woman.  You are 50 years of age or older. What are the signs or symptoms? Symptoms of this condition usually happen when you move your head or your eyes in different directions. Symptoms may start suddenly, and usually last for less than a minute. They include:  Loss of balance and falling.  Feeling like you are spinning or moving.  Feeling like your surroundings are spinning or moving.  Nausea and vomiting.  Blurred vision.  Dizziness.  Involuntary eye movement (nystagmus). Symptoms can be mild and cause only minor problems, or they can be severe and interfere with daily life. Episodes of benign positional vertigo may return (recur) over time. Symptoms may improve over time. How is this diagnosed? This condition may be diagnosed based on:  Your medical history.  Physical exam of the head, neck, and ears.  Tests, such as: ? MRI. ? CT scan. ? Eye movement tests. Your health care provider may ask you to change positions quickly while he or she watches you for symptoms  of benign positional vertigo, such as nystagmus. Eye movement may be tested with a variety of exams that are designed to evaluate or stimulate vertigo. ? An electroencephalogram (EEG). This records electrical activity in your brain. ? Hearing tests. You may be referred to a health care provider who specializes in ear, nose, and throat (ENT) problems (otolaryngologist) or a provider who specializes in disorders of the nervous system (neurologist). How is this treated?  This condition may be treated in a session in which your health care provider moves your head in specific positions to adjust your inner ear back to normal. Treatment for this condition may take several sessions. Surgery may be needed in severe cases, but this is rare. In some cases, benign positional vertigo may resolve on its own in 2-4 weeks. Follow these instructions at home: Safety  Move slowly. Avoid sudden body or head movements or certain positions, as told by your health care provider.  Avoid driving until your health care provider says it is safe for you to do so.  Avoid operating heavy machinery until your health care provider says it is safe for you to do so.  Avoid doing any tasks that would be dangerous to you or others if vertigo occurs.  If you have trouble walking or keeping your balance, try using a cane for stability. If you feel dizzy or unstable, sit down right away.  Return to your normal activities as told by your health care provider. Ask your health care provider what activities are safe for you. General instructions  Take over-the-counter   and prescription medicines only as told by your health care provider.  Drink enough fluid to keep your urine pale yellow.  Keep all follow-up visits as told by your health care provider. This is important. Contact a health care provider if:  You have a fever.  Your condition gets worse or you develop new symptoms.  Your family or friends notice any  behavioral changes.  You have nausea or vomiting that gets worse.  You have numbness or a "pins and needles" sensation. Get help right away if you:  Have difficulty speaking or moving.  Are always dizzy.  Faint.  Develop severe headaches.  Have weakness in your legs or arms.  Have changes in your hearing or vision.  Develop a stiff neck.  Develop sensitivity to light. Summary  Vertigo is the feeling that you or your surroundings are moving when they are not. Benign positional vertigo is the most common form of vertigo.  The cause of this condition is not known. It may be caused by a disturbance in an area of the inner ear that helps your brain to sense movement and balance.  Symptoms include loss of balance and falling, feeling that you or your surroundings are moving, nausea and vomiting, and blurred vision.  This condition can be diagnosed based on symptoms, physical exam, and other tests, such as MRI, CT scan, eye movement tests, and hearing tests.  Follow safety instructions as told by your health care provider. You will also be told when to contact your health care provider in case of problems. This information is not intended to replace advice given to you by your health care provider. Make sure you discuss any questions you have with your health care provider. Document Revised: 01/31/2018 Document Reviewed: 01/31/2018 [employer]vier Patient Education  2020 [employer]  

## 2020-05-06 NOTE — Progress Notes (Signed)
Established Patient Office Visit  Subjective:  Patient ID: Martin Broadhead., male    DOB: [birth year]  Age: 54 y.o. MRN: [id number]  CC:  Chief Complaint  Patient presents with  . Dizziness    HPI Martin Mota. presents for dizziness past few days.  He initially described this as lightheaded.  Then this was described as "room spinning ".  This seems to be related to changing positions.  He has not had any associated nausea or vomiting.  No hearing changes.  No visual changes.  No focal weakness.  No speech changes.  No swallowing difficulties.  He is not aware of prior history of vertigo.  He had recent right total knee replacement back in July.  He has done well since then.  He is engaged in regular physical therapy.  He denies any recent syncope.  No orthostatic type symptoms.  Past Medical History:  Diagnosis Date  . Allergy   . Arthritis    osetoarthritis   . Headache(784.0)   . Hemorrhoids   . Macular degeneration    beginnings 10-12-17  . Tubular adenoma of colon     Past Surgical History:  Procedure Laterality Date  . bone graft L hand  1992  . COLONOSCOPY    . KNEE ARTHROSCOPY  2012   L knee  . KNEE ARTHROSCOPY  2013   rt knee  . POLYPECTOMY    . TONSILLECTOMY  1974    Family History  Problem Relation Age of Onset  . Hypertension Mother   . Breast cancer Mother   . Arthritis Father   . Arthritis Other   . Colon cancer Neg Hx   . Colon polyps Neg Hx   . Esophageal cancer Neg Hx   . Rectal cancer Neg Hx   . Stomach cancer Neg Hx     Social History   Socioeconomic History  . Marital status: Married    Spouse name: Not on file  . Number of children: 2  . Years of education: Not on file  . Highest education level: Not on file  Occupational History  . Occupation: [employer]  Tobacco Use  . Smoking status: Never Smoker  . Smokeless tobacco: Never Used  Vaping Use  . Vaping Use: Never used  Substance and Sexual Activity  . Alcohol use: Yes     Alcohol/week: 1.0 standard drink    Types: 1 Cans of beer per week    Comment: occ  . Drug use: No  . Sexual activity: Not on file  Other Topics Concern  . Not on file  Social History Narrative  . Not on file   Social Determinants of Health   Financial Resource Strain:   . Difficulty of Paying Living Expenses: Not on file  Food Insecurity:   . Worried About Charity fundraiser in the Last Year: Not on file  . Ran Out of Food in the Last Year: Not on file  Transportation Needs:   . Lack of Transportation (Medical): Not on file  . Lack of Transportation (Non-Medical): Not on file  Physical Activity:   . Days of Exercise per Week: Not on file  . Minutes of Exercise per Session: Not on file  Stress:   . Feeling of Stress : Not on file  Social Connections:   . Frequency of Communication with Friends and Family: Not on file  . Frequency of Social Gatherings with Friends and Family: Not on file  . Attends Religious Services: Not  on file  . Active Member of Clubs or Organizations: Not on file  . Attends Archivist Meetings: Not on file  . Marital Status: Not on file  Intimate Partner Violence:   . Fear of Current or Ex-Partner: Not on file  . Emotionally Abused: Not on file  . Physically Abused: Not on file  . Sexually Abused: Not on file    Outpatient Medications Prior to Visit  Medication Sig Dispense Refill  . Biotin 5000 MCG TABS Take 1 tablet by mouth daily.    . cyanocobalamin 500 MCG tablet Take 2,000 mcg by mouth daily.     Marland Kitchen ibuprofen (ADVIL,MOTRIN) 200 MG tablet Take by mouth as needed.     . NON FORMULARY Vit d 5,000    . scopolamine (TRANSDERM-SCOP, 1.5 MG,) 1 MG/3DAYS Place 1 patch (1.5 mg total) onto the skin every 3 (three) days. 4 patch 1  . sildenafil (REVATIO) 20 MG tablet TAKE 2-5 TABLETS BY MOUTH DAILY AS NEEDED FOR SEXUAL ACTIVITY 30 tablet 1   Facility-Administered Medications Prior to Visit  Medication Dose Route Frequency Provider Last Rate  Last Admin  . 0.9 %  sodium chloride infusion  500 mL Intravenous Once Milus Banister, MD        Allergies  Allergen Reactions  . Penicillins     ROS Review of Systems  Constitutional: Negative for chills, fever and unexpected weight change.  Respiratory: Negative for cough and shortness of breath.   Cardiovascular: Negative for chest pain.  Neurological: Positive for dizziness. Negative for seizures, syncope, facial asymmetry, speech difficulty, weakness and headaches.      Objective:    Physical Exam Vitals reviewed.  Constitutional:      Appearance: Normal appearance.  Cardiovascular:     Rate and Rhythm: Normal rate and regular rhythm.  Pulmonary:     Effort: Pulmonary effort is normal.     Breath sounds: Normal breath sounds.  Neurological:     General: No focal deficit present.     Mental Status: He is alert and oriented to person, place, and time.     Cranial Nerves: No cranial nerve deficit.     Motor: No weakness.     Coordination: Coordination normal.     Gait: Gait normal.     Comments: He does have some mild vertigo with head turned to the left going seated to supine     BP 110/80   Pulse 79   Temp 98.2 F (36.8 C) (Other (Comment))   Ht 5\' 10"  (1.778 m)   Wt 235 lb (106.6 kg)   SpO2 99%   BMI 33.72 kg/m  Wt Readings from Last 3 Encounters:  05/06/20 235 lb (106.6 kg)  02/05/20 233 lb 4.8 oz (105.8 kg)  10/30/18 218 lb (98.9 kg)     Health Maintenance Due  Topic Date Due  . Hepatitis C Screening  Never done  . COVID-19 Vaccine (1) Never done  . HIV Screening  Never done    There are no preventive care reminders to display for this patient.  Lab Results  Component Value Date   TSH 1.89 10/30/2018   Lab Results  Component Value Date   WBC 5.9 02/05/2020   HGB 15.0 02/05/2020   HCT 43.2 02/05/2020   MCV 88.5 02/05/2020   PLT 199.0 02/05/2020   Lab Results  Component Value Date   NA 138 02/05/2020   K 4.6 02/05/2020   CO2 30  02/05/2020   GLUCOSE  84 02/05/2020   BUN 14 02/05/2020   CREATININE 1.04 02/05/2020   BILITOT 0.6 02/05/2020   ALKPHOS 78 02/05/2020   AST 14 02/05/2020   ALT 15 02/05/2020   PROT 6.5 02/05/2020   ALBUMIN 4.4 02/05/2020   CALCIUM 9.2 02/05/2020   GFR 74.39 02/05/2020   Lab Results  Component Value Date   CHOL 182 02/05/2020   Lab Results  Component Value Date   HDL 46.00 02/05/2020   Lab Results  Component Value Date   LDLCALC 102 (H) 10/30/2018   Lab Results  Component Value Date   TRIG 204.0 (H) 02/05/2020   Lab Results  Component Value Date   CHOLHDL 4 02/05/2020   Lab Results  Component Value Date   HGBA1C 4.9 02/05/2020      Assessment & Plan:   Approximately 4-day history of relatively mild vertigo symptoms. Suspect benign peripheral positional vertigo to the left. Symptoms were triggered as above on exam only with head in the left position. Otherwise nonfocal neuro exam  -We recommended trial Epley maneuvers with handout given. -Discussed red flags for more worrisome vertigo and to follow-up promptly if any those are noticed. -Touch base if vertigo symptoms not fully resolving over the next couple of days with Epley maneuvers . No orders of the defined types were placed in this encounter.   Follow-up: No follow-ups on file.    Carolann Littler, MD

## 2020-05-26 ENCOUNTER — Telehealth: Payer: Self-pay | Admitting: Family Medicine

## 2020-05-26 NOTE — Telephone Encounter (Signed)
The patient called in today had some concerns about his office visit with Dr. Elease Hashimoto on 02/05/2020. He said this visit was not coded correctly, and billing has sent him a bill. He stated this visit was supposed to be under worker comp; he brought in the information providing it was workers comp at the time of the visit. . The patient called billing and said no one called him back. He wanted to talk to a supervisor to get help on this issue. [phone]. I informed him our supervisor was out of the office. He wanted me to forward a message anyway.

## 2020-05-29 DIAGNOSIS — J3489 Other specified disorders of nose and nasal sinuses: Secondary | ICD-10-CM | POA: Insufficient documentation

## 2020-06-11 ENCOUNTER — Telehealth: Payer: Self-pay | Admitting: Family Medicine

## 2020-06-11 NOTE — Telephone Encounter (Signed)
We can offer virtual follow up (if he would like).   Could also set up with Dr Maudie Mercury if she has any openings.

## 2020-06-11 NOTE — Telephone Encounter (Signed)
Patient calling to make Dr. Elease Hashimoto aware he done a rapid test at [facility] and tested postive for COVID.

## 2020-06-11 NOTE — Telephone Encounter (Signed)
Spoke with the pt and offered a virtual visit with Dr Maudie Mercury.  Patient declined as he stated he does not feel this is needed at this time.  Patient was advised to call back if needed and I also informed the pt if he feels worse or any other problems occur to go to the ER or contact us for an appt with the Covid clinic.

## 2020-07-22 ENCOUNTER — Telehealth (INDEPENDENT_AMBULATORY_CARE_PROVIDER_SITE_OTHER): Payer: BC Managed Care – PPO | Admitting: Family Medicine

## 2020-07-22 ENCOUNTER — Telehealth: Payer: Self-pay | Admitting: Family Medicine

## 2020-07-22 DIAGNOSIS — R059 Cough, unspecified: Secondary | ICD-10-CM | POA: Diagnosis not present

## 2020-07-22 MED ORDER — HYDROCODONE-HOMATROPINE 5-1.5 MG/5ML PO SYRP: 5.0000 mL | ORAL_SOLUTION | Freq: Four times a day (QID) | ORAL | 0 refills | Status: AC | PRN

## 2020-07-22 MED ORDER — HYDROCODONE-HOMATROPINE 5-1.5 MG/5ML PO SYRP: 5.0000 mL | ORAL_SOLUTION | Freq: Four times a day (QID) | ORAL | 0 refills | Status: DC | PRN

## 2020-07-22 NOTE — Progress Notes (Signed)
Patient ID: Martin Bennett., male   DOB: 1966-06-24, 54 y.o.   MRN: [id number]  This visit type was conducted due to national recommendations for restrictions regarding the COVID-19 pandemic in an effort to limit this patient's exposure and mitigate transmission in our community.   Virtual Visit via Video Note  I connected with Martin Bennett on 07/22/20 at 10:15 AM EST by a video enabled telemedicine application and verified that I am speaking with the correct person using two identifiers.  Location patient: home Location provider:work or home office Persons participating in the virtual visit: patient, provider  I discussed the limitations of evaluation and management by telemedicine and the availability of in person appointments. The patient expressed understanding and agreed to proceed.   HPI: Martin Bennett called with onset last week of some postnasal drip and cough symptoms.  Cough mostly nonproductive.  Worse at night.  He has been taking some Allegra for postnasal drip.  He has had some recent increase in GERD symptoms.  He has some Pepcid but not taking yet.  He has substernal burning sensation intermittently.  No dysphagia.  He states that he had Covid about a month ago and was fully recovered until recent cough started.  He has no dyspnea.  No recent sick contacts.  No chronic lung issues.  No recent appetite or weight changes  His cough has been fairly severe at night and interfering with sleep.  Not relieved with over-the-counter medication such as Delsym   ROS: See pertinent positives and negatives per HPI.  Past Medical History:  Diagnosis Date  . Allergy   . Arthritis    osetoarthritis   . Headache(784.0)   . Hemorrhoids   . Macular degeneration    beginnings 10-12-17  . Tubular adenoma of colon     Past Surgical History:  Procedure Laterality Date  . bone graft L hand  1992  . COLONOSCOPY    . KNEE ARTHROSCOPY  2012   L knee  . KNEE ARTHROSCOPY  2013   rt knee  .  POLYPECTOMY    . TONSILLECTOMY  1974    Family History  Problem Relation Age of Onset  . Hypertension Mother   . Breast cancer Mother   . Arthritis Father   . Arthritis Other   . Colon cancer Neg Hx   . Colon polyps Neg Hx   . Esophageal cancer Neg Hx   . Rectal cancer Neg Hx   . Stomach cancer Neg Hx     SOCIAL HX: Non-smoker   Current Outpatient Medications:  .  Biotin 5000 MCG TABS, Take 1 tablet by mouth daily., Disp: , Rfl:  .  cyanocobalamin 500 MCG tablet, Take 2,000 mcg by mouth daily. , Disp: , Rfl:  .  Fexofenadine HCl (ALLEGRA PO), Take by mouth., Disp: , Rfl:  .  ibuprofen (ADVIL,MOTRIN) 200 MG tablet, Take by mouth as needed. , Disp: , Rfl:  .  NON FORMULARY, Vit d 5,000, Disp: , Rfl:  .  scopolamine (TRANSDERM-SCOP, 1.5 MG,) 1 MG/3DAYS, Place 1 patch (1.5 mg total) onto the skin every 3 (three) days., Disp: 4 patch, Rfl: 1 .  sildenafil (REVATIO) 20 MG tablet, TAKE 2-5 TABLETS BY MOUTH DAILY AS NEEDED FOR SEXUAL ACTIVITY, Disp: 30 tablet, Rfl: 1 .  HYDROcodone-homatropine (HYCODAN) 5-1.5 MG/5ML syrup, Take 5 mLs by mouth every 6 (six) hours as needed for up to 10 days., Disp: 120 mL, Rfl: 0  Current Facility-Administered Medications:  .  0.9 %  sodium chloride infusion, 500 mL, Intravenous, Once, Milus Banister, MD  EXAM:  VITALS per patient if applicable:  GENERAL: alert, oriented, appears well and in no acute distress  HEENT: atraumatic, conjunttiva clear, no obvious abnormalities on inspection of external nose and ears  NECK: normal movements of the head and neck  LUNGS: on inspection no signs of respiratory distress, breathing rate appears normal, no obvious gross SOB, gasping or wheezing  CV: no obvious cyanosis  MS: moves all visible extremities without noticeable abnormality  PSYCH/NEURO: pleasant and cooperative, no obvious depression or anxiety, speech and thought processing grossly intact  ASSESSMENT AND PLAN:  Discussed the following  assessment and plan:  Cough-suspect acute viral bronchitis  -Wrote for limited Hycodan cough syrup 1 teaspoon nightly for severe cough -Follow-up promptly for any fever, increased shortness of breath, or other concerns -We suggest that he try over-the-counter Pepcid 20 mg twice daily for GERD symptoms and be in touch if this is not controlling those.     I discussed the assessment and treatment plan with the patient. The patient was provided an opportunity to ask questions and all were answered. The patient agreed with the plan and demonstrated an understanding of the instructions.   The patient was advised to call back or seek an in-person evaluation if the symptoms worsen or if the condition fails to improve as anticipated.     Carolann Littler, MD

## 2020-07-22 NOTE — Telephone Encounter (Signed)
Hycodan Rx sent to [facility]

## 2020-07-22 NOTE — Telephone Encounter (Signed)
[facility] is [facility] in stating that they do not have the hydrocodone-homatropine (HYCODAN) 5-1.5 MG syrup but if you can send it to the [facility] in [place] b/c they have it.

## 2020-07-25 ENCOUNTER — Ambulatory Visit (INDEPENDENT_AMBULATORY_CARE_PROVIDER_SITE_OTHER): Payer: BC Managed Care – PPO | Admitting: Family Medicine

## 2020-07-25 ENCOUNTER — Other Ambulatory Visit: Payer: Self-pay

## 2020-07-25 ENCOUNTER — Encounter: Payer: Self-pay | Admitting: Family Medicine

## 2020-07-25 VITALS — BP 132/78 | HR 90 | Temp 97.4°F | Ht 70.0 in | Wt 243.0 lb

## 2020-07-25 DIAGNOSIS — R059 Cough, unspecified: Secondary | ICD-10-CM

## 2020-07-25 MED ORDER — BENZONATATE 200 MG PO CAPS: 200.0000 mg | ORAL_CAPSULE | Freq: Two times a day (BID) | ORAL | 1 refills | Status: DC | PRN

## 2020-07-25 NOTE — Progress Notes (Signed)
Patient: Martin Bennett. MRN: [id number] DOB: Jun 25, 1966 PCP: Eulas Post, MD     Subjective:  Chief Complaint  Patient presents with  . Cough    HPI: The patient is a 54 y.o. male who presents today for nasal congestion and drainage. Cough is productive, pt reports clear to cloudy. Symptoms started about a week ago and have progressively gotten worse. Cough is wet and productive in nature with clear/cloudy sputum. He has no shortness of breath or wheezing. He will have coughing fits and it will give him a headache. He has sinus pain or pressure, ear pain, sore throat. He has been using his flonase and started this Wednesday. No fevers, has had chills. No nausea/vomiting.    He had a virtual visit on 07/22/20 and suspected acute viral bronchitis. Was given hycodan cough syrup.   He had Covid over a month ago.    Also had GERD symptoms and was advised to take pepcid over the counter. Only taking once/day and has not had any symptoms for the past few days.   Review of Systems  Constitutional: Negative for chills, fatigue and fever.  HENT: Positive for postnasal drip. Negative for congestion, ear pain, sinus pressure, sinus pain and sore throat.   Respiratory: Positive for cough. Negative for shortness of breath and wheezing.   Cardiovascular: Negative for chest pain and palpitations.  Gastrointestinal: Negative for abdominal pain, diarrhea, nausea and vomiting.    Allergies Patient is allergic to penicillins.  Past Medical History Patient  has a past medical history of Allergy, Arthritis, Headache(784.0), Hemorrhoids, Macular degeneration, and Tubular adenoma of colon.  Surgical History Patient  has a past surgical history that includes Tonsillectomy (1974); bone graft L hand (1992); Knee arthroscopy (2012); Knee arthroscopy (2013); Colonoscopy; Polypectomy; and Replacement total knee (03/30/2020).  Family History Pateint's family history includes Arthritis in his  father and another family member; Breast cancer in his mother; Hypertension in his mother.  Social History Patient  reports that he has never smoked. He has never used smokeless tobacco. He reports current alcohol use of about 1.0 standard drink of alcohol per week. He reports that he does not use drugs.    Objective: Vitals:   07/25/20 1025 07/25/20 1052  BP: (!) 141/92 132/78  Pulse: 90   Temp: (!) 97.4 F (36.3 C)   TempSrc: Temporal   SpO2: 98%   Weight: 243 lb (110.2 kg)   Height: 5\' 10"  (1.778 m)     Body mass index is 34.87 kg/m.  Physical Exam Vitals reviewed.  Constitutional:      Appearance: Normal appearance. He is obese.  HENT:     Head: Normocephalic and atraumatic.     Comments: No ttp over sinuses    Right Ear: Tympanic membrane, ear canal and external ear normal.     Left Ear: Tympanic membrane, ear canal and external ear normal.     Nose: Nose normal.     Mouth/Throat:     Mouth: Mucous membranes are moist.  Cardiovascular:     Rate and Rhythm: Normal rate and regular rhythm.     Heart sounds: Normal heart sounds.  Pulmonary:     Effort: Pulmonary effort is normal. No respiratory distress.     Breath sounds: Normal breath sounds. No wheezing or rales.  Abdominal:     General: Bowel sounds are normal.     Palpations: Abdomen is soft.  Musculoskeletal:     Cervical back: Normal range of motion and neck  supple.  Skin:    General: Skin is warm.     Capillary Refill: Capillary refill takes less than 2 seconds.  Neurological:     General: No focal deficit present.     Mental Status: He is alert and oriented to person, place, and time.  Psychiatric:        Mood and Affect: Mood normal.        Behavior: Behavior normal.        Assessment/plan: 1. Cough Discussed likely viral bronchitis. Sounds great, exam clear. Conservative therapy with cool mist humidifier at night, rest/fluids, and honey daily. Recommend 1 tablespoon/day. Also recommended over  the counter anti tussive medication: robitussin DM during the day and could do a nyquil at night. Sending in tessalon pearls prn for cough. Discussed viral in nature and no indication for antibiotics at this point. Precautions given for worsening symptoms, fever, shortness of breath to let us know immediatly.     This visit occurred during the SARS-CoV-2 public health emergency.  Safety protocols were in place, including screening questions prior to the visit, additional usage of staff PPE, and extensive cleaning of exam room while observing appropriate contact time as indicated for disinfecting solutions.     Return if symptoms worsen or fail to improve.   Orma Flaming, MD [place]   07/25/2020

## 2020-07-25 NOTE — Patient Instructions (Signed)
-your exam is clear. Likely viral bronchitis. No antibiotics needed at this time. Can cough for 6 weeks! Recommend cool mist humidifier at night, honey off spoon daily -tessalon pearls sent in prn for cough -continue cough syrup Dr Elease Hashimoto gave you at night -I like robitussin DM during day.   If fever/worsening cough please let him know!  So nice to meet you!  Dr. Rogers Blocker    Acute Bronchitis, Adult  Acute bronchitis is when air tubes in the lungs (bronchi) suddenly get swollen. The condition can make it hard for you to breathe. In adults, acute bronchitis usually goes away within 2 weeks. A cough caused by bronchitis may last up to 3 weeks. Smoking, allergies, and asthma can make the condition worse. What are the causes? This condition is caused by:  Cold and flu viruses. The most common cause of this condition is the virus that causes the common cold.  Bacteria.  Substances that irritate the lungs, including: ? Smoke from cigarettes and other types of tobacco. ? Dust and pollen. ? Fumes from chemicals, gases, or burned fuel. ? Other materials that pollute indoor or outdoor air.  Close contact with someone who has acute bronchitis. What increases the risk? The following factors may make you more likely to develop this condition:  A weak body's defense system. This is also called the immune system.  Any condition that affects your lungs and breathing, such as asthma. What are the signs or symptoms? Symptoms of this condition include:  A cough.  Coughing up clear, yellow, or green mucus.  Wheezing.  Chest congestion.  Shortness of breath.  A fever.  Body aches.  Chills.  A sore throat. How is this treated? Acute bronchitis may go away over time without treatment. Your doctor may recommend:  Drinking more fluids.  Taking a medicine for a fever or cough.  Using a device that gets medicine into your lungs (inhaler).  Using a vaporizer or a humidifier. These  are machines that add water or moisture in the air to help with coughing and poor breathing. Follow these instructions at home:  Activity  Get a lot of rest.  Avoid places where there are fumes from chemicals.  Return to your normal activities as told by your doctor. Ask your doctor what activities are safe for you. Lifestyle  Drink enough fluids to keep your pee (urine) pale yellow.  Do not drink alcohol.  Do not use any products that contain nicotine or tobacco, such as cigarettes, e-cigarettes, and chewing tobacco. If you need help quitting, ask your doctor. Be aware that: ? Your bronchitis will get worse if you smoke or breathe in other people's smoke (secondhand smoke). ? Your lungs will heal faster if you quit smoking. General instructions  Take over-the-counter and prescription medicines only as told by your doctor.  Use an inhaler, cool mist vaporizer, or humidifier as told by your doctor.  Rinse your mouth often with salt water. To make salt water, dissolve -1 tsp (3-6 g) of salt in 1 cup (237 mL) of warm water.  Keep all follow-up visits as told by your doctor. This is important. How is this prevented? To lower your risk of getting this condition again:  Wash your hands often with soap and water. If soap and water are not available, use hand sanitizer.  Avoid contact with people who have cold symptoms.  Try not to touch your mouth, nose, or eyes with your hands.  Make sure to get the flu  shot every year. Contact a doctor if:  Your symptoms do not get better in 2 weeks.  You vomit more than once or twice.  You have symptoms of loss of fluid from your body (dehydration). These include: ? Dark urine. ? Dry skin or eyes. ? Increased thirst. ? Headaches. ? Confusion. ? Muscle cramps. Get help right away if:  You cough up blood.  You have chest pain.  You have very bad shortness of breath.  You become dehydrated.  You faint or keep feeling like you  are going to faint.  You keep vomiting.  You have a very bad headache.  Your fever or chills get worse. These symptoms may be an emergency. Do not wait to see if the symptoms will go away. Get medical help right away. Call your local emergency services (911 in the [place] Do not drive yourself to the hospital. Summary  Acute bronchitis is when air tubes in the lungs (bronchi) suddenly get swollen. In adults, acute bronchitis usually goes away within 2 weeks.  Take over-the-counter and prescription medicines only as told by your doctor.  Drink enough fluid to keep your pee (urine) pale yellow.  Contact a doctor if your symptoms do not improve after 2 weeks of treatment.  Get help right away if you cough up blood, faint, or have chest pain or shortness of breath. This information is not intended to replace advice given to you by your health care provider. Make sure you discuss any questions you have with your health care provider. Document Revised: 03/15/2019 Document Reviewed: 03/15/2019 Elsevier Patient Education  [employer].

## 2020-12-28 ENCOUNTER — Other Ambulatory Visit: Payer: Self-pay | Admitting: Family Medicine

## 2021-02-23 ENCOUNTER — Telehealth: Payer: BC Managed Care – PPO | Admitting: Family Medicine

## 2021-02-23 ENCOUNTER — Telehealth: Payer: Self-pay | Admitting: Family Medicine

## 2021-02-23 NOTE — Telephone Encounter (Signed)
Patient had virtual visit scheduled with Dr. Elease Hashimoto 06/21 at 3:00pm for fever, cough and sinus infection from a couple weeks ago. Patient called to cancel because he states that Dr. Elease Hashimoto will not be able to do much through a virtual visit.   Patient requested a call from Dr. Elease Hashimoto.  Patient's number is [phone].  Please advise.

## 2021-02-23 NOTE — Telephone Encounter (Signed)
FYI

## 2021-02-24 NOTE — Telephone Encounter (Signed)
Spoke with the patient, his symptoms started last Wednesday. I explained that due to [facility]'s policy we would have to wait one more week before the patient can be seen in office. He stated he is very frustrated with the situation and asked for the number to Patient Experience. The contact information was given. Patient then stated that he has already gotten what he needed and no longer needs an appointment. Nothing further needed at this time.  Will send to Dr. Elease Hashimoto as [name]

## 2021-05-24 ENCOUNTER — Other Ambulatory Visit: Payer: Self-pay

## 2021-05-24 ENCOUNTER — Ambulatory Visit (INDEPENDENT_AMBULATORY_CARE_PROVIDER_SITE_OTHER): Payer: BC Managed Care – PPO | Admitting: Family Medicine

## 2021-05-24 ENCOUNTER — Encounter: Payer: Self-pay | Admitting: Family Medicine

## 2021-05-24 VITALS — BP 124/68 | HR 71 | Temp 98.1°F | Ht 70.0 in | Wt 225.7 lb

## 2021-05-24 DIAGNOSIS — Z0000 Encounter for general adult medical examination without abnormal findings: Secondary | ICD-10-CM | POA: Diagnosis not present

## 2021-05-24 DIAGNOSIS — D229 Melanocytic nevi, unspecified: Secondary | ICD-10-CM

## 2021-05-24 MED ORDER — SILDENAFIL CITRATE 100 MG PO TABS: 50.0000 mg | ORAL_TABLET | Freq: Every day | ORAL | 11 refills | Status: DC | PRN

## 2021-05-24 NOTE — Progress Notes (Signed)
Established Patient Office Visit  Subjective:  Patient ID: Martin Peres., male    DOB: August 09, 1966  Age: 55 y.o. MRN: [id number]  CC: No chief complaint on file.   HPI Martin Bennett. presents for physical exam.  Generally fairly healthy.  He has history of degenerative arthritis especially involving the knees.  Hx of partial knee replacement.   Past history of migraine headaches.  Health maintenance reviewed:  -Tetanus due 2030 -Colonoscopy due 2029 -Hepatitis C screening- -No history of Shingrix vaccine  Family history-mother with history of hypertension and breast cancer.  His father just retired age 76 and sounds to be in remarkably good health overall.  Social history: Married with 2 children.  He has 1 daughter who is living in [place].  The other attends a college down in [place] and plays collegiate soccer.  Patient is non-smoker.  Works for [employer].  Past Medical History:  Diagnosis Date   Allergy    Arthritis    osetoarthritis    Headache(784.0)    Hemorrhoids    Macular degeneration    beginnings 10-12-17   Tubular adenoma of colon     Past Surgical History:  Procedure Laterality Date   bone graft L hand  1992   COLONOSCOPY     KNEE ARTHROSCOPY  2012   L knee   KNEE ARTHROSCOPY  2013   rt knee   POLYPECTOMY     REPLACEMENT TOTAL KNEE  03/30/2020   Partial; right knee   TONSILLECTOMY  1974    Family History  Problem Relation Age of Onset   Hypertension Mother    Breast cancer Mother    Arthritis Father    Arthritis Other    Colon cancer Neg Hx    Colon polyps Neg Hx    Esophageal cancer Neg Hx    Rectal cancer Neg Hx    Stomach cancer Neg Hx     Social History   Socioeconomic History   Marital status: Married    Spouse name: Not on file   Number of children: 2   Years of education: Not on file   Highest education level: Not on file  Occupational History   Occupation: [employer]  Tobacco Use   Smoking status: Never   Smokeless  tobacco: Never  Vaping Use   Vaping Use: Never used  Substance and Sexual Activity   Alcohol use: Yes    Alcohol/week: 1.0 standard drink    Types: 1 Cans of beer per week    Comment: occ   Drug use: No   Sexual activity: Not on file  Other Topics Concern   Not on file  Social History Narrative   Not on file   Social Determinants of Health   Financial Resource Strain: Not on file  Food Insecurity: Not on file  Transportation Needs: Not on file  Physical Activity: Not on file  Stress: Not on file  Social Connections: Not on file  Intimate Partner Violence: Not on file    Outpatient Medications Prior to Visit  Medication Sig Dispense Refill   benzonatate (TESSALON) 200 MG capsule Take 1 capsule (200 mg total) by mouth 2 (two) times daily as needed for cough. 30 capsule 1   Biotin 5000 MCG TABS Take 1 tablet by mouth daily.     cyanocobalamin 500 MCG tablet Take 2,000 mcg by mouth daily.      Fexofenadine HCl (ALLEGRA PO) Take by mouth.     ibuprofen (ADVIL,MOTRIN) 200 MG  tablet Take by mouth as needed.      NON FORMULARY Vit d 5,000     scopolamine (TRANSDERM-SCOP, 1.5 MG,) 1 MG/3DAYS Place 1 patch (1.5 mg total) onto the skin every 3 (three) days. (Patient not taking: Reported on 07/25/2020) 4 patch 1   sildenafil (REVATIO) 20 MG tablet TAKE 2-5 TABLETS BY MOUTH DAILY AS NEEDED FOR SEXUAL ACTIVITY 30 tablet 1   zinc gluconate 50 MG tablet Take 50 mg by mouth daily.     Facility-Administered Medications Prior to Visit  Medication Dose Route Frequency Provider Last Rate Last Admin   0.9 %  sodium chloride infusion  500 mL Intravenous Once Milus Banister, MD        Allergies  Allergen Reactions   Penicillins     ROS Review of Systems  Constitutional:  Negative for activity change, appetite change, fatigue and fever.  HENT:  Negative for congestion, ear pain and trouble swallowing.   Eyes:  Negative for pain and visual disturbance.  Respiratory:  Negative for cough,  shortness of breath and wheezing.   Cardiovascular:  Negative for chest pain and palpitations.  Gastrointestinal:  Negative for abdominal distention, abdominal pain, blood in stool, constipation, diarrhea, nausea, rectal pain and vomiting.  Genitourinary:  Negative for dysuria, hematuria and testicular pain.  Musculoskeletal:  Negative for arthralgias and joint swelling.  Skin:  Negative for rash.  Neurological:  Negative for dizziness, syncope and headaches.  Hematological:  Negative for adenopathy.  Psychiatric/Behavioral:  Negative for confusion and dysphoric mood.      Objective:    Physical Exam Constitutional:      General: He is not in acute distress.    Appearance: He is well-developed.  HENT:     Head: Normocephalic and atraumatic.     Right Ear: External ear normal.     Left Ear: External ear normal.  Eyes:     Conjunctiva/sclera: Conjunctivae normal.     Pupils: Pupils are equal, round, and reactive to light.  Neck:     Thyroid: No thyromegaly.  Cardiovascular:     Rate and Rhythm: Normal rate and regular rhythm.     Heart sounds: Normal heart sounds. No murmur heard. Pulmonary:     Effort: No respiratory distress.     Breath sounds: No wheezing or rales.  Abdominal:     General: Bowel sounds are normal. There is no distension.     Palpations: Abdomen is soft. There is no mass.     Tenderness: There is no abdominal tenderness. There is no guarding or rebound.  Musculoskeletal:     Cervical back: Normal range of motion and neck supple.     Right lower leg: No edema.     Left lower leg: No edema.  Lymphadenopathy:     Cervical: No cervical adenopathy.  Skin:    Findings: No rash.  Neurological:     Mental Status: He is alert and oriented to person, place, and time.     Cranial Nerves: No cranial nerve deficit.     Deep Tendon Reflexes: Reflexes normal.    There were no vitals taken for this visit. Wt Readings from Last 3 Encounters:  07/25/20 243 lb (110.2  kg)  05/06/20 235 lb (106.6 kg)  02/05/20 233 lb 4.8 oz (105.8 kg)     Health Maintenance Due  Topic Date Due   COVID-19 Vaccine (1) Never done   HIV Screening  Never done   Hepatitis C Screening  Never done  Zoster Vaccines- Shingrix (1 of 2) Never done    There are no preventive care reminders to display for this patient.  Lab Results  Component Value Date   TSH 1.89 10/30/2018   Lab Results  Component Value Date   WBC 5.9 02/05/2020   HGB 15.0 02/05/2020   HCT 43.2 02/05/2020   MCV 88.5 02/05/2020   PLT 199.0 02/05/2020   Lab Results  Component Value Date   NA 138 02/05/2020   K 4.6 02/05/2020   CO2 30 02/05/2020   GLUCOSE 84 02/05/2020   BUN 14 02/05/2020   CREATININE 1.04 02/05/2020   BILITOT 0.6 02/05/2020   ALKPHOS 78 02/05/2020   AST 14 02/05/2020   ALT 15 02/05/2020   PROT 6.5 02/05/2020   ALBUMIN 4.4 02/05/2020   CALCIUM 9.2 02/05/2020   GFR 74.39 02/05/2020   Lab Results  Component Value Date   CHOL 182 02/05/2020   Lab Results  Component Value Date   HDL 46.00 02/05/2020   Lab Results  Component Value Date   LDLCALC 102 (H) 10/30/2018   Lab Results  Component Value Date   TRIG 204.0 (H) 02/05/2020   Lab Results  Component Value Date   CHOLHDL 4 02/05/2020   Lab Results  Component Value Date   HGBA1C 4.9 02/05/2020      Assessment & Plan:   Physical exam.  He has history of osteoarthritis but otherwise relatively healthy.  We discussed the following health maintenance items  -Obtain screening labs as above -Offered flu vaccine but he declines -We discussed setting up dermatology referral for his multiple nevi -He had questions regarding getting colonoscopy repeat less than 10-year interval.  We have encouraged him to discuss this with GI   No orders of the defined types were placed in this encounter.   Follow-up: No follow-ups on file.    Carolann Littler, MD

## 2021-05-25 ENCOUNTER — Other Ambulatory Visit (INDEPENDENT_AMBULATORY_CARE_PROVIDER_SITE_OTHER): Payer: BC Managed Care – PPO

## 2021-05-25 DIAGNOSIS — Z0000 Encounter for general adult medical examination without abnormal findings: Secondary | ICD-10-CM

## 2021-05-25 LAB — BASIC METABOLIC PANEL
BUN: 13 mg/dL (ref 7–25)
CO2: 25 mmol/L (ref 20–32)
Calcium: 8.9 mg/dL (ref 8.6–10.3)
Chloride: 105 mmol/L (ref 98–110)
Creat: 0.99 mg/dL (ref 0.70–1.30)
Glucose, Bld: 97 mg/dL (ref 65–99)
Potassium: 4.2 mmol/L (ref 3.5–5.3)
Sodium: 138 mmol/L (ref 135–146)

## 2021-05-25 LAB — HEPATIC FUNCTION PANEL
AG Ratio: 2.1 (calc) (ref 1.0–2.5)
ALT: 20 U/L (ref 9–46)
AST: 17 U/L (ref 10–35)
Albumin: 4.1 g/dL (ref 3.6–5.1)
Alkaline phosphatase (APISO): 79 U/L (ref 35–144)
Bilirubin, Direct: 0.1 mg/dL (ref 0.0–0.2)
Globulin: 2 g/dL (calc) (ref 1.9–3.7)
Indirect Bilirubin: 0.5 mg/dL (calc) (ref 0.2–1.2)
Total Bilirubin: 0.6 mg/dL (ref 0.2–1.2)
Total Protein: 6.1 g/dL (ref 6.1–8.1)

## 2021-05-25 LAB — CBC WITH DIFFERENTIAL/PLATELET
Absolute Monocytes: 478 cells/uL (ref 200–950)
Basophils Absolute: 42 cells/uL (ref 0–200)
Basophils Relative: 0.8 %
Eosinophils Absolute: 250 cells/uL (ref 15–500)
Eosinophils Relative: 4.8 %
HCT: 46.7 % (ref 38.5–50.0)
Hemoglobin: 15.8 g/dL (ref 13.2–17.1)
Lymphs Abs: 1243 cells/uL (ref 850–3900)
MCH: 29.9 pg (ref 27.0–33.0)
MCHC: 33.8 g/dL (ref 32.0–36.0)
MCV: 88.4 fL (ref 80.0–100.0)
MPV: 11.3 fL (ref 7.5–12.5)
Monocytes Relative: 9.2 %
Neutro Abs: 3188 cells/uL (ref 1500–7800)
Neutrophils Relative %: 61.3 %
Platelets: 209 10*3/uL (ref 140–400)
RBC: 5.28 10*6/uL (ref 4.20–5.80)
RDW: 12.9 % (ref 11.0–15.0)
Total Lymphocyte: 23.9 %
WBC: 5.2 10*3/uL (ref 3.8–10.8)

## 2021-05-25 LAB — PSA: PSA: 0.34 ng/mL (ref <=4.00)

## 2021-05-25 LAB — LIPID PANEL
Cholesterol: 171 mg/dL (ref <200)
HDL: 49 mg/dL (ref >=40)
LDL Cholesterol (Calc): 100 mg/dL (calc) — ABNORMAL HIGH
Non-HDL Cholesterol (Calc): 122 mg/dL (calc) (ref <130)
Total CHOL/HDL Ratio: 3.5 (calc) (ref <5.0)
Triglycerides: 119 mg/dL (ref <150)

## 2021-05-25 LAB — TSH: TSH: 3 mIU/L (ref 0.40–4.50)

## 2021-05-25 NOTE — Addendum Note (Signed)
Addended by: Amanda Cockayne on: 05/25/2021 08:47 AM   Modules accepted: Orders

## 2021-06-01 IMAGING — DX DG KNEE COMPLETE 4+V*R*
5 series · 5 of 5 positions shown · non-contrast
Comparison: March 18, 2015

CLINICAL DATA: Pain.  Fall several months prior

EXAM:
RIGHT KNEE - COMPLETE 4+ VIEW

[knee pa (1 of 2)]
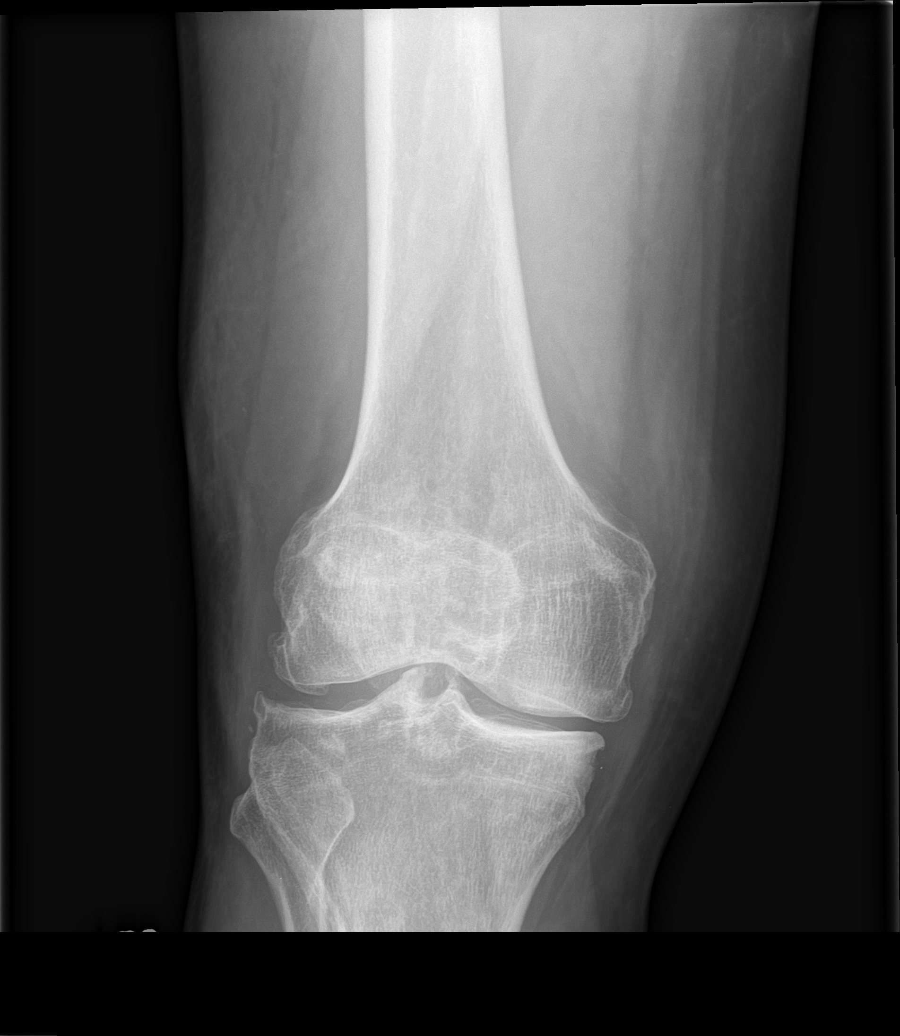

[knee obl (1 of 2)]
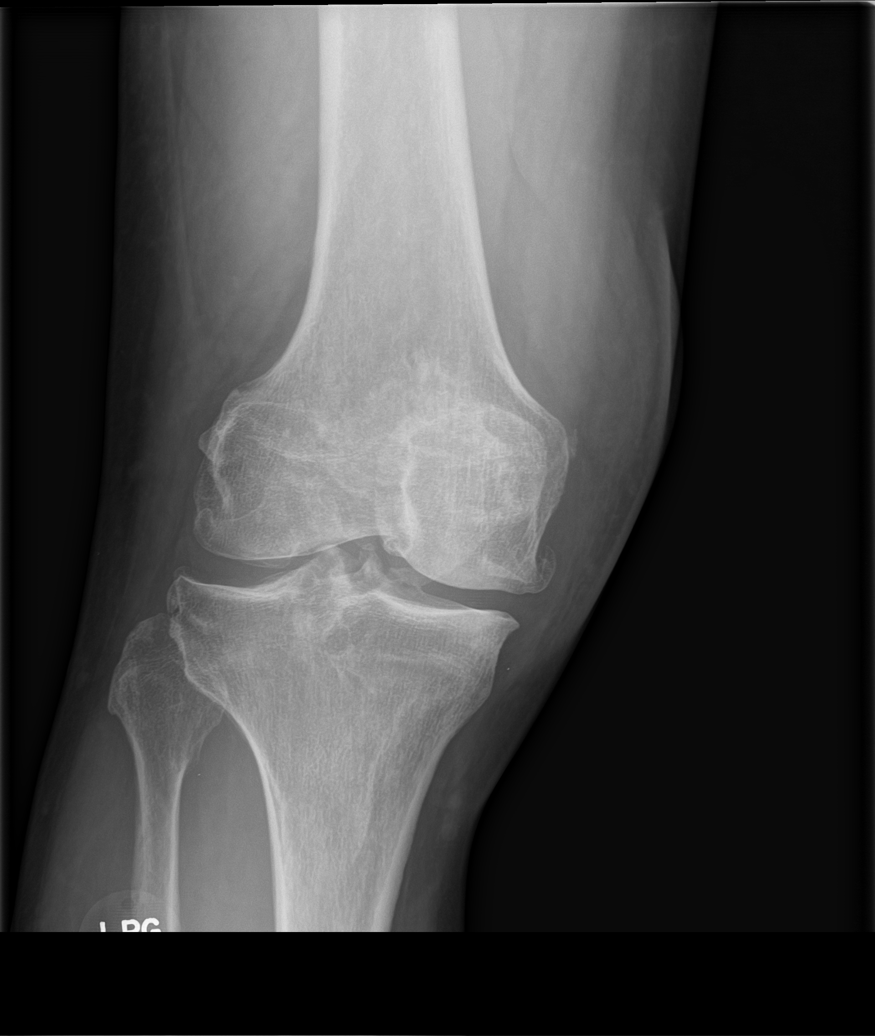

[knee obl (2 of 2)]
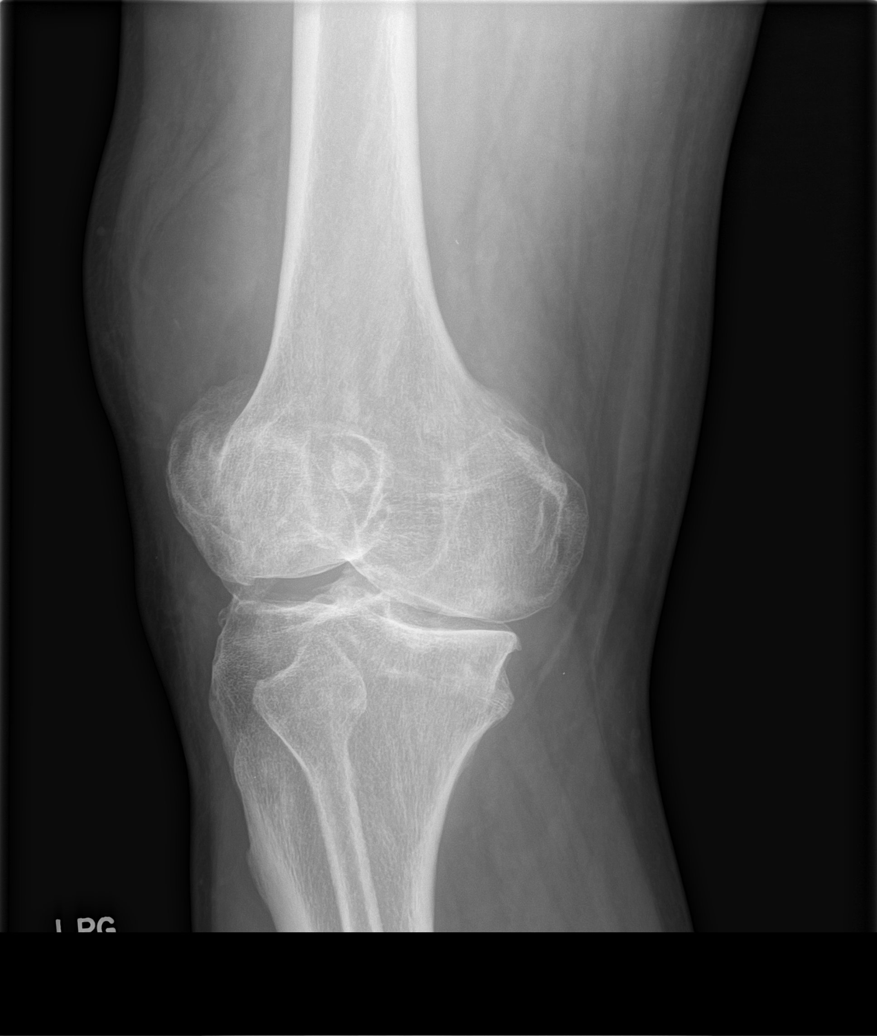

[knee lat]
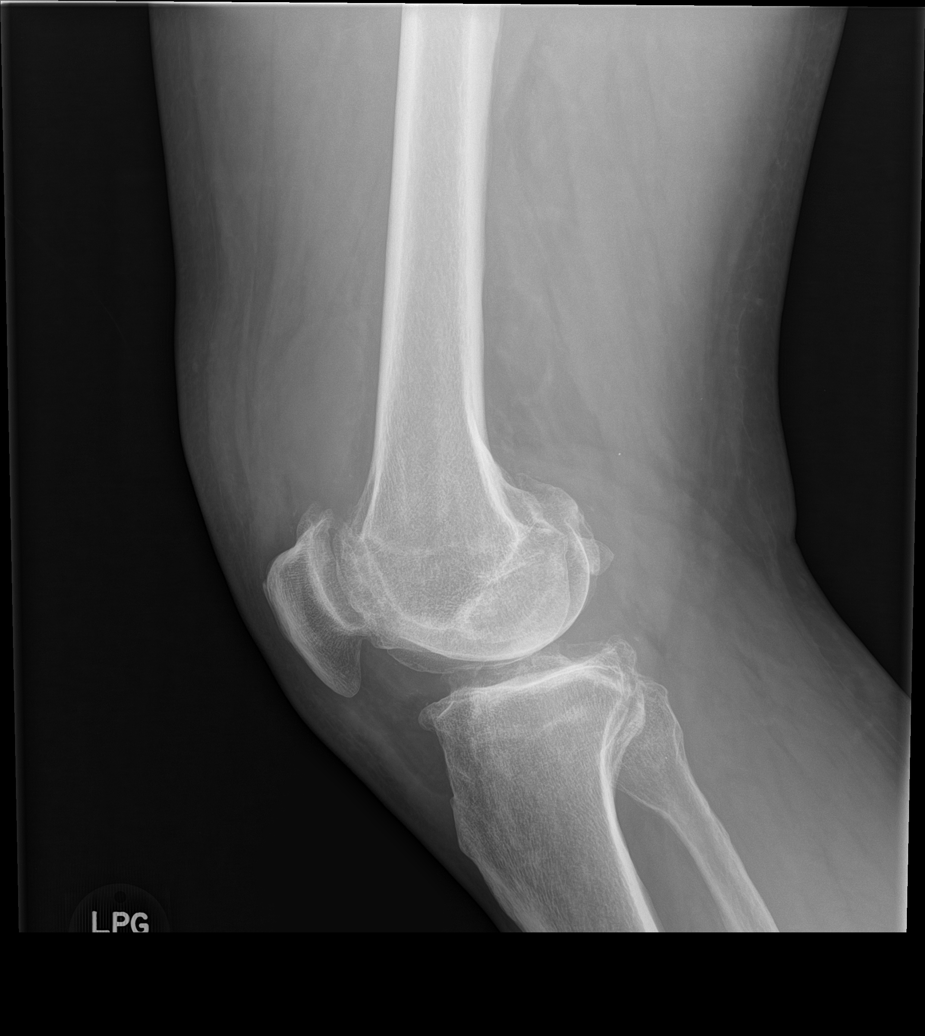

[knee pa (2 of 2)]
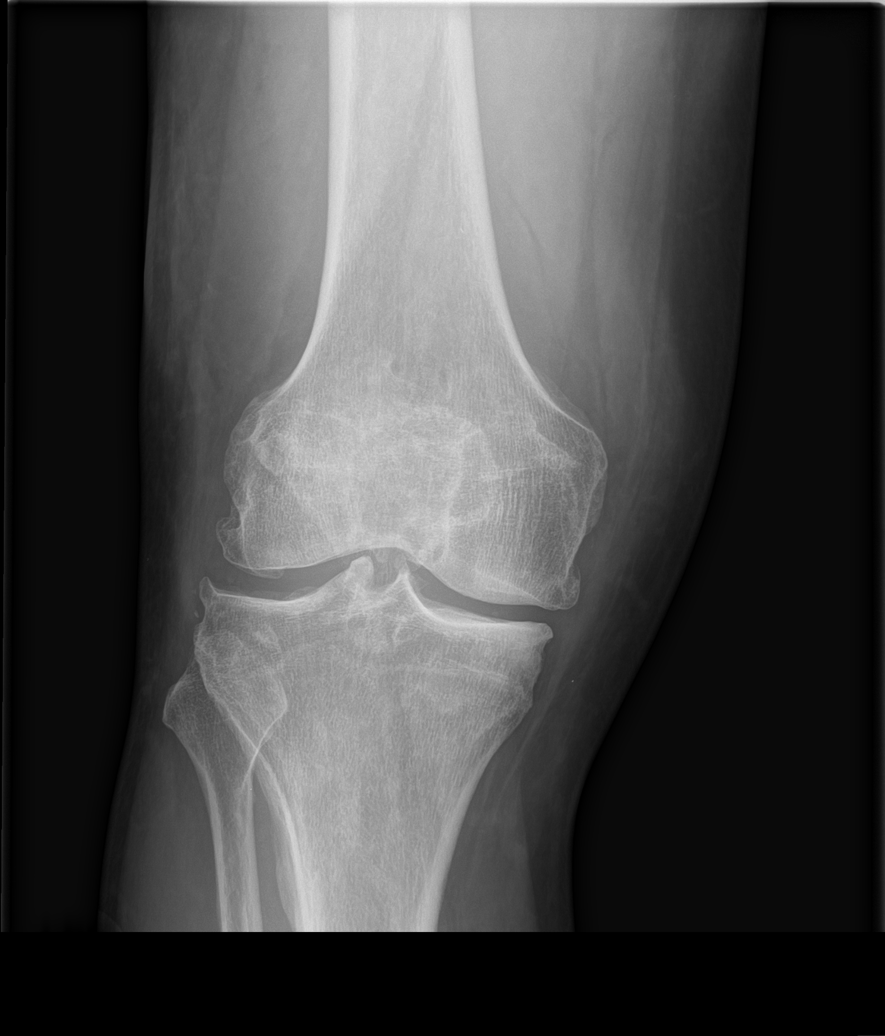

[5 of 5 positions shown; findings below may reference images not displayed]

FINDINGS: Frontal, lateral, and bilateral oblique views were obtained. No
fracture or dislocation. There is a sizable joint effusion. There is
moderately severe joint space narrowing in the patellofemoral joint
with moderate narrowing medially and laterally. There is spurring in
all compartments. No erosive change.
IMPRESSION: Generalized osteoarthritic change, most severe in the patellofemoral
joint. Sizable joint effusion. No acute fracture or dislocation
evident.

## 2021-12-16 ENCOUNTER — Ambulatory Visit (INDEPENDENT_AMBULATORY_CARE_PROVIDER_SITE_OTHER): Payer: BC Managed Care – PPO | Admitting: Sports Medicine

## 2021-12-16 VITALS — BP 128/82 | Ht 70.5 in | Wt 215.0 lb

## 2021-12-16 DIAGNOSIS — R269 Unspecified abnormalities of gait and mobility: Secondary | ICD-10-CM | POA: Insufficient documentation

## 2021-12-16 NOTE — Progress Notes (Signed)
? ?  Martin Bennett. is a 56 y.o. male who presents to [facility] today for the following: ? ?Gait Abnormality ?Presents today for orthotics  ?Has a history of plantar fasciitis ?Has never had orthotics before ? ? ?PMH reviewed.  ?ROS as above. ?Medications reviewed. ? ?Exam:  ?BP 128/82   Ht 5' 10.5" (1.791 m)   Wt 215 lb (97.5 kg)   BMI 30.41 kg/m?  ?Gen: Well NAD ?MSK: ? ?Bilateral Feet:  ?Inspection:  Pes cavus, mild supination with gait, slight lateral deviation of toes 2-5 b/l, knees in varus deformity on left>right ?Palpation: Mild TTP of origin of plantar fascia on left ?Neurovascular: N/V intact distally in the lower extremity b/l ?Special tests: Normal calcaneal motion with heel raise ? ? ?No results found. ? ? ?Assessment and Plan: ?1) Abnormality of gait ?Patient was fitted for a : standard, cushioned, semi-rigid orthotic. ?The orthotic was heated and afterward the patient stood on the orthotic blank positioned on the orthotic stand. ?The patient was positioned in subtalar neutral position and 10 degrees of ankle dorsiflexion in a weight bearing stance. ?After completion of molding, a stable base was applied to the orthotic blank. ?The blank was ground to a stable position for weight bearing. ?Size: 11 ?Base: blue EVA ?Posting: none ?Additional orthotic padding: none ? ?Total time spent with the patient was 30 minutes with greater than 50% of the time spent in face-to-face consultation discussing orthotic construction, instruction, and sitting. Gait was neutral with orthotics in place. Patient found them to be comfortable. Follow-up as needed. ? ? ?Arizona Constable, D.O.  ?PGY-4 [facility]  ?12/16/2021 3:51 PM ? ?I observed and examined the patient with the resident and agree with assessment and plan.  Note reviewed and modified by me. ?Ila Mcgill, MD ?

## 2021-12-16 NOTE — Assessment & Plan Note (Signed)
Patient was fitted for a : standard, cushioned, semi-rigid orthotic. ?The orthotic was heated and afterward the patient stood on the orthotic blank positioned on the orthotic stand. ?The patient was positioned in subtalar neutral position and 10 degrees of ankle dorsiflexion in a weight bearing stance. ?After completion of molding, a stable base was applied to the orthotic blank. ?The blank was ground to a stable position for weight bearing. ?Size: 11 ?Base: blue EVA ?Posting: none ?Additional orthotic padding: none ? ?Total time spent with the patient was 30 minutes with greater than 50% of the time spent in face-to-face consultation discussing orthotic construction, instruction, and sitting. Gait was neutral with orthotics in place. Patient found them to be comfortable. Follow-up as needed. ? ?

## 2021-12-27 ENCOUNTER — Encounter: Payer: Self-pay | Admitting: Gastroenterology

## 2021-12-27 ENCOUNTER — Ambulatory Visit: Payer: BC Managed Care – PPO | Admitting: Gastroenterology

## 2021-12-27 VITALS — BP 138/64 | HR 68 | Ht 70.0 in | Wt 225.4 lb

## 2021-12-27 DIAGNOSIS — K649 Unspecified hemorrhoids: Secondary | ICD-10-CM | POA: Diagnosis not present

## 2021-12-27 NOTE — Patient Instructions (Addendum)
If you are age 56 or younger, your body mass index should be between 19-25. Your Body mass index is 32.34 kg/m?Marland Kitchen If this is out of the aformentioned range listed, please consider follow up with your Primary Care Provider.  ?________________________________________________________ ? ?The [facility] providers would like to encourage you to use [facility] to communicate with providers for non-urgent requests or questions.  Due to long hold times on the telephone, sending your provider a message by [facility] may be a faster and more efficient way to get a response.  Please allow 48 business hours for a response.  Please remember that this is for non-urgent requests.  ?_______________________________________________________ ? ?You have been scheduled for hemorrhoid banding on 01-24-22 at 3:10pm ? ?Thank you for entrusting me with your care and choosing [facility]. ? ?Dr Ardis Hughs ? ? ? ? ?

## 2021-12-27 NOTE — Progress Notes (Signed)
Review of pertinent gastrointestinal problems: ?1. Adenomatous polyp: colonoscopy Dr. Ardis Hughs 01/2012 single small polyp, recommended recall at 5 years.  Colonoscopy 10/2017 Dr. Ardis Hughs found a single subcentimeter polyp.  This was not precancerous on pathology.  Also hemorrhoids.  He was recommended to have repeat colonoscopy for routine risk colon cancer screening at 10 years ?2. Medium to large external hemorrhoids, referred to surgery 01/2012 however he has declined several times.  ? ?HPI: ?This is a very pleasant 56 year old man ? ? ?I last saw him about 4 years ago the time of a colonoscopy.  See those results summarized above.  At the time of that visit I recommended again that he consider treatment for his hemorrhoids, specifically mentioned in office banding for his mixed internal/external hemorrhoids. ? ?Blood work 2022 CBC was normal. ? ?He continues to have intermittent rectal bleeding.  He has also had some fecal soilage.  He can tell he has hemorrhoids in his backside.  Sometimes he has some discomfort there.  These symptoms can all last every single day for a month and then be absent for anywhere from 3 to 6 months. ? ? ?Review of systems: ?Pertinent positive and negative review of systems were noted in the above HPI section. All other review negative. ? ? ?Past Medical History:  ?Diagnosis Date  ? Allergy   ? Arthritis   ? osetoarthritis   ? Headache(784.0)   ? Hemorrhoids   ? Macular degeneration   ? beginnings 10-12-17  ? Tubular adenoma of colon   ? ? ?Past Surgical History:  ?Procedure Laterality Date  ? bone graft L hand  1992  ? COLONOSCOPY    ? KNEE ARTHROSCOPY  2012  ? L knee  ? KNEE ARTHROSCOPY  2013  ? rt knee  ? PARTIAL KNEE ARTHROPLASTY    ? POLYPECTOMY    ? TONSILLECTOMY  1974  ? ? ?Current Outpatient Medications  ?Medication Instructions  ? b complex vitamins capsule 1 capsule, Oral, Daily  ? Biotin 5000 MCG TABS 1 tablet, Oral, Daily  ? Fexofenadine HCl (ALLEGRA PO) Oral  ? ibuprofen  (ADVIL,MOTRIN) 200 MG tablet Oral, As needed  ? NON FORMULARY Vit d 5,000   ? Saw Palmetto 450 MG CAPS 1 capsule, Oral, Daily  ? scopolamine (TRANSDERM-SCOP (1.5 MG)) 1.5 mg, Transdermal, every 72 hours  ? sildenafil (REVATIO) 20 MG tablet TAKE 2-5 TABLETS BY MOUTH DAILY AS NEEDED FOR SEXUAL ACTIVITY  ? sildenafil (VIAGRA) 50-100 mg, Oral, Daily PRN  ? vitamin B-12 (CYANOCOBALAMIN) 2,000 mcg, Oral, Daily  ? zinc gluconate 50 mg, Oral, Daily  ? ? ?Allergies as of 12/27/2021 - Review Complete 12/27/2021  ?Allergen Reaction Noted  ? Penicillins  01/25/2010  ? ? ?Family History  ?Problem Relation Age of Onset  ? Hypertension Mother   ? Breast cancer Mother   ? Arthritis Father   ? Arthritis Other   ? Colon cancer Neg Hx   ? Colon polyps Neg Hx   ? Esophageal cancer Neg Hx   ? Rectal cancer Neg Hx   ? Stomach cancer Neg Hx   ? ? ?Social History  ? ?Socioeconomic History  ? Marital status: Married  ?  Spouse name: Not on file  ? Number of children: 2  ? Years of education: Not on file  ? Highest education level: Not on file  ?Occupational History  ? Occupation: [employer]  ?Tobacco Use  ? Smoking status: Never  ? Smokeless tobacco: Never  ?Vaping Use  ? Vaping  Use: Never used  ?Substance and Sexual Activity  ? Alcohol use: Yes  ?  Alcohol/week: 1.0 standard drink  ?  Types: 1 Cans of beer per week  ?  Comment: occ  ? Drug use: No  ? Sexual activity: Not on file  ?Other Topics Concern  ? Not on file  ?Social History Narrative  ? Not on file  ? ?Social Determinants of Health  ? ?Financial Resource Strain: Not on file  ?Food Insecurity: Not on file  ?Transportation Needs: Not on file  ?Physical Activity: Not on file  ?Stress: Not on file  ?Social Connections: Not on file  ?Intimate Partner Violence: Not on file  ? ? ? ?Physical Exam: ?BP 138/64   Pulse 68   Ht '5\' 10"'$  (1.778 m)   Wt 225 lb 6 oz (102.2 kg)   SpO2 98%   BMI 32.34 kg/m?  ?Constitutional: generally well-appearing ?Psychiatric: alert and oriented x3 ?Eyes:  extraocular movements intact ?Mouth: oral pharynx moist, no lesions ?Neck: supple no lymphadenopathy ?Cardiovascular: heart regular rate and rhythm ?Lungs: clear to auscultation bilaterally ?Abdomen: soft, nontender, nondistended, no obvious ascites, no peritoneal signs, normal bowel sounds ?Extremities: no lower extremity edema bilaterally ?Skin: no lesions on visible extremities ?Rectal examination: Obvious slightly thrombosed external hemorrhoid and suggestion of prolapsing internal hemorrhoid.  No rectal masses palpated.  Stool was brown and not checked for Hemoccult ? ? ?Assessment and plan: ?56 y.o. male with symptomatic hemorrhoids ? ?I do think he has internal and external hemorrhoids on exam today and that was also my impression during colonoscopy a little over 4 years ago.  He has no constipation.  He is indeed interested in seeing one of my partners for internal hemorrhoid banding procedure.  I explained to him that I do not think he needs repeat colonoscopy before then.  We will make that referral. ? ? ?Please see the "Patient Instructions" section for addition details about the plan. ? ? ?Owens Loffler, MD ?[facility] ?12/27/2021, 3:34 PM ? ?Cc: Eulas Post, MD ? ?Total time on date of encounter was 40  minutes (this included time spent preparing to see the patient reviewing records; obtaining and/or reviewing separately obtained history; performing a medically appropriate exam and/or evaluation; counseling and educating the patient and family if present; ordering medications, tests or procedures if applicable; and documenting clinical information in the health record). ? ? ?

## 2022-01-24 ENCOUNTER — Encounter: Payer: BC Managed Care – PPO | Admitting: Internal Medicine

## 2022-01-24 ENCOUNTER — Encounter: Payer: Self-pay | Admitting: Internal Medicine

## 2022-01-24 ENCOUNTER — Encounter: Payer: BC Managed Care – PPO | Admitting: Gastroenterology

## 2022-02-03 ENCOUNTER — Telehealth: Payer: Self-pay

## 2022-02-03 NOTE — Telephone Encounter (Signed)
Pt was notified of Dr. Carlean Purl recommendations. Pt requested to have appointment rescheduled to 02/28/2022 at 2:50: Pt verbalized understanding with all questions answered.

## 2022-02-03 NOTE — Telephone Encounter (Signed)
-----   Message from Gatha Mayer, MD sent at 02/02/2022 11:54 AM EDT ----- Regarding: [name] for patient Martin Bennett,  This guy rescheduled a banding appointment with me the other day - he came but I was running a bit behind and he had to get to work.  When you can please contact him (phone note) and explain that I recommend that it is best he does not go back to work the same day of banding. Not an absolute recommendation but though the banding is not typically painful there can be some discomfort and we advise against vigorous physical activity, long car rides or heavy lifting the day of the banding and sometimes the next day.  Also - there is not a guarantee that I will do banding - it is likely but ultimately depends upon my evaluation at the time.  Apologies if this was not communicated previously. (He was sent by a partner)  Thanks  CEG

## 2022-02-22 ENCOUNTER — Encounter: Payer: BC Managed Care – PPO | Admitting: Internal Medicine

## 2022-02-22 ENCOUNTER — Telehealth: Payer: Self-pay | Admitting: Internal Medicine

## 2022-02-22 NOTE — Telephone Encounter (Signed)
This man has a banding appointment next week  When he was here before I was running slightly behind and he left to go to work and rescheuled. I think he was under the impression he could go to work after the procedure.  He should know:  Sometimes can return to work after but no long car/truck rides after and avoid heavy lifting straining. (He works at [employer])  In general if physical job or driving - should not go to work right after the procedures.  Also - the banding is likely but sometimes after I assess the patient I do not recommend it.  Just trying to have him fully informed.  Please call him and explain the above.

## 2022-02-22 NOTE — Telephone Encounter (Unsigned)
Left message for pt to call back  °

## 2022-02-23 NOTE — Telephone Encounter (Signed)
Pt made aware Dr. Carlean Purl recommendations: Pt verbalized understanding with all questions answered.

## 2022-02-28 ENCOUNTER — Encounter: Payer: Self-pay | Admitting: Internal Medicine

## 2022-02-28 ENCOUNTER — Ambulatory Visit (INDEPENDENT_AMBULATORY_CARE_PROVIDER_SITE_OTHER): Payer: BC Managed Care – PPO | Admitting: Internal Medicine

## 2022-02-28 VITALS — BP 134/74 | HR 84 | Ht 70.0 in | Wt 220.0 lb

## 2022-02-28 DIAGNOSIS — K648 Other hemorrhoids: Secondary | ICD-10-CM | POA: Diagnosis not present

## 2022-02-28 DIAGNOSIS — K644 Residual hemorrhoidal skin tags: Secondary | ICD-10-CM

## 2022-02-28 MED ORDER — HYDROCORTISONE ACETATE 25 MG RE SUPP: 25.0000 mg | Freq: Every day | RECTAL | 1 refills | Status: DC

## 2022-03-01 ENCOUNTER — Ambulatory Visit (INDEPENDENT_AMBULATORY_CARE_PROVIDER_SITE_OTHER): Payer: BC Managed Care – PPO | Admitting: Sports Medicine

## 2022-03-01 VITALS — BP 128/76 | Ht 71.0 in | Wt 220.0 lb

## 2022-03-01 DIAGNOSIS — R269 Unspecified abnormalities of gait and mobility: Secondary | ICD-10-CM | POA: Diagnosis not present

## 2022-03-01 DIAGNOSIS — M722 Plantar fascial fibromatosis: Secondary | ICD-10-CM

## 2022-05-25 ENCOUNTER — Encounter: Payer: BC Managed Care – PPO | Admitting: Family Medicine

## 2022-06-01 ENCOUNTER — Encounter: Payer: Self-pay | Admitting: Family Medicine

## 2022-06-01 ENCOUNTER — Ambulatory Visit (INDEPENDENT_AMBULATORY_CARE_PROVIDER_SITE_OTHER): Payer: BC Managed Care – PPO | Admitting: Family Medicine

## 2022-06-01 VITALS — BP 104/60 | HR 55 | Temp 97.6°F | Ht 71.26 in | Wt 223.5 lb

## 2022-06-01 DIAGNOSIS — Z0000 Encounter for general adult medical examination without abnormal findings: Secondary | ICD-10-CM

## 2022-06-01 NOTE — Addendum Note (Signed)
Addended by: Octavio Manns E on: 06/01/2022 07:42 AM   Modules accepted: Orders

## 2022-06-01 NOTE — Progress Notes (Signed)
Established Patient Office Visit  Subjective   Patient ID: Martin Spittler., male    DOB: February 09, 1966  Age: 56 y.o. MRN: [id number]  Chief Complaint  Patient presents with   Annual Exam    HPI    Martin Bennett is seen for physical exam.  He has history of osteoarthritis and has had some ongoing issues with hemorrhoids but overall fairly healthy.  Takes no regular medications.  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  Maintenance reviewed  -Declines flu vaccine -No history of hepatitis C screening.  Low risk. -Declined shingles vaccine -Colonoscopy due 2029 -Tetanus due 2030  Family history-mother with history of hypertension and breast cancer.  His father just retired age 48 and sounds to be in remarkably good health overall.  His father just retired a year ago.   Social history: Married with 2 children.  He has 1 daughter who is living in [place].  The other attends a college down in [place] and plays collegiate soccer.  Patient is non-smoker.  Works for [employer].  Past Medical History:  Diagnosis Date   Allergy    Arthritis    osetoarthritis    Headache(784.0)    Hemorrhoids    Macular degeneration    beginnings 10-12-17   Tubular adenoma of colon    Past Surgical History:  Procedure Laterality Date   bone graft L hand  1992   COLONOSCOPY     KNEE ARTHROSCOPY  2012   L knee   KNEE ARTHROSCOPY  2013   rt knee   PARTIAL KNEE ARTHROPLASTY     POLYPECTOMY     TONSILLECTOMY  1974    reports that he has never smoked. He has never used smokeless tobacco. He reports current alcohol use of about 1.0 standard drink of alcohol per week. He reports that he does not use drugs. family history includes Arthritis in his father and another family member; Breast cancer in his mother; Hypertension in his mother. Allergies  Allergen Reactions   Penicillins     Review of Systems  Constitutional:  Negative for chills, fever, malaise/fatigue and weight loss.   HENT:  Negative for hearing loss.   Eyes:  Negative for blurred vision and double vision.  Respiratory:  Negative for cough and shortness of breath.   Cardiovascular:  Negative for chest pain, palpitations and leg swelling.  Gastrointestinal:  Negative for abdominal pain, blood in stool, constipation and diarrhea.  Genitourinary:  Negative for dysuria.  Skin:  Negative for rash.  Neurological:  Negative for dizziness, speech change, seizures, loss of consciousness and headaches.  Psychiatric/Behavioral:  Negative for depression.       Objective:     BP 104/60 (BP Location: Left Arm, Patient Position: Sitting, Cuff Size: Normal)   Pulse (!) 55   Temp 97.6 F (36.4 C) (Oral)   Ht 5' 11.26" (1.81 m)   Wt 223 lb 8 oz (101.4 kg)   SpO2 98%   BMI 30.94 kg/m    Physical Exam Vitals reviewed.  Constitutional:      General: He is not in acute distress.    Appearance: He is well-developed.  HENT:     Head: Normocephalic and atraumatic.     Right Ear: External ear normal.     Left Ear: External ear normal.  Eyes:     Conjunctiva/sclera: Conjunctivae normal.     Pupils: Pupils are equal, round, and reactive to light.  Neck:     Thyroid: No  thyromegaly.  Cardiovascular:     Rate and Rhythm: Normal rate and regular rhythm.     Heart sounds: Normal heart sounds. No murmur heard. Pulmonary:     Effort: No respiratory distress.     Breath sounds: No wheezing or rales.  Abdominal:     General: Bowel sounds are normal. There is no distension.     Palpations: Abdomen is soft. There is no mass.     Tenderness: There is no abdominal tenderness. There is no guarding or rebound.  Musculoskeletal:     Cervical back: Normal range of motion and neck supple.  Lymphadenopathy:     Cervical: No cervical adenopathy.  Skin:    Findings: No rash.  Neurological:     Mental Status: He is alert and oriented to person, place, and time.     Cranial Nerves: No cranial nerve deficit.      No  results found for any visits on 06/01/22.    The 10-year ASCVD risk score (Arnett DK, et al., 2019) is: 3.7%    Assessment & Plan:   Problem List Items Addressed This Visit   None Visit Diagnoses     Physical exam    -  Primary   Relevant Orders   Basic metabolic panel   Lipid panel   CBC with Differential/Platelet   TSH   Hepatic function panel   PSA   Hep C Antibody     Generally healthy 56 year old male.  We discussed the following health maintenance issues  -Check hepatitis C antibody, though he is low risk -Obtain labs as above including PSA -Offered flu vaccine and he declines -Discussed Shingrix and he declines -Recommend trying to lose a few pounds.  No follow-ups on file.    Carolann Littler, MD

## 2022-06-01 NOTE — Addendum Note (Signed)
Addended by: Octavio Manns E on: 06/01/2022 07:40 AM   Modules accepted: Orders

## 2022-06-02 LAB — CBC WITH DIFFERENTIAL/PLATELET
Absolute Monocytes: 527 cells/uL (ref 200–950)
Basophils Absolute: 32 cells/uL (ref 0–200)
Basophils Relative: 0.7 %
Eosinophils Absolute: 207 cells/uL (ref 15–500)
Eosinophils Relative: 4.6 %
HCT: 46.7 % (ref 38.5–50.0)
Hemoglobin: 16 g/dL (ref 13.2–17.1)
Lymphs Abs: 995 cells/uL (ref 850–3900)
MCH: 30.5 pg (ref 27.0–33.0)
MCHC: 34.3 g/dL (ref 32.0–36.0)
MCV: 89.1 fL (ref 80.0–100.0)
MPV: 11.9 fL (ref 7.5–12.5)
Monocytes Relative: 11.7 %
Neutro Abs: 2741 cells/uL (ref 1500–7800)
Neutrophils Relative %: 60.9 %
Platelets: 178 10*3/uL (ref 140–400)
RBC: 5.24 10*6/uL (ref 4.20–5.80)
RDW: 12.2 % (ref 11.0–15.0)
Total Lymphocyte: 22.1 %
WBC: 4.5 10*3/uL (ref 3.8–10.8)

## 2022-06-02 LAB — LIPID PANEL
Cholesterol: 182 mg/dL (ref <200)
HDL: 50 mg/dL (ref >=40)
LDL Cholesterol (Calc): 114 mg/dL (calc) — ABNORMAL HIGH
Non-HDL Cholesterol (Calc): 132 mg/dL (calc) — ABNORMAL HIGH (ref <130)
Total CHOL/HDL Ratio: 3.6 (calc) (ref <5.0)
Triglycerides: 84 mg/dL (ref <150)

## 2022-06-02 LAB — HEPATITIS C ANTIBODY: Hepatitis C Ab: NONREACTIVE

## 2022-06-02 LAB — PSA: PSA: 0.32 ng/mL (ref <=4.00)

## 2022-06-02 LAB — BASIC METABOLIC PANEL
BUN: 20 mg/dL (ref 7–25)
CO2: 25 mmol/L (ref 20–32)
Calcium: 9 mg/dL (ref 8.6–10.3)
Chloride: 105 mmol/L (ref 98–110)
Creat: 1.1 mg/dL (ref 0.70–1.30)
Glucose, Bld: 90 mg/dL (ref 65–99)
Potassium: 4.1 mmol/L (ref 3.5–5.3)
Sodium: 140 mmol/L (ref 135–146)

## 2022-06-02 LAB — HEPATIC FUNCTION PANEL
AG Ratio: 2 (calc) (ref 1.0–2.5)
ALT: 19 U/L (ref 9–46)
AST: 17 U/L (ref 10–35)
Albumin: 4.2 g/dL (ref 3.6–5.1)
Alkaline phosphatase (APISO): 69 U/L (ref 35–144)
Bilirubin, Direct: 0.2 mg/dL (ref 0.0–0.2)
Globulin: 2.1 g/dL (calc) (ref 1.9–3.7)
Indirect Bilirubin: 0.6 mg/dL (calc) (ref 0.2–1.2)
Total Bilirubin: 0.8 mg/dL (ref 0.2–1.2)
Total Protein: 6.3 g/dL (ref 6.1–8.1)

## 2022-06-02 LAB — TSH: TSH: 2.34 mIU/L (ref 0.40–4.50)

## 2022-07-07 ENCOUNTER — Ambulatory Visit: Payer: BC Managed Care – PPO | Admitting: Sports Medicine

## 2022-07-07 ENCOUNTER — Ambulatory Visit: Payer: Self-pay

## 2022-07-07 VITALS — BP 132/74 | Ht 71.0 in | Wt 220.0 lb

## 2022-07-07 DIAGNOSIS — S92302G Fracture of unspecified metatarsal bone(s), left foot, subsequent encounter for fracture with delayed healing: Secondary | ICD-10-CM | POA: Insufficient documentation

## 2022-07-07 DIAGNOSIS — S92354A Nondisplaced fracture of fifth metatarsal bone, right foot, initial encounter for closed fracture: Secondary | ICD-10-CM

## 2022-07-07 DIAGNOSIS — M79671 Pain in right foot: Secondary | ICD-10-CM

## 2022-07-07 NOTE — Assessment & Plan Note (Signed)
Avulsion fracture appears to be from peroneus tertius insertion onto head of 5th metatarsal. Due to significant mileage during work.  -Body helix compression sleeve -Shoe padding -Ice everyday, limit walking as able -Return if not improving

## 2022-07-07 NOTE — Assessment & Plan Note (Signed)
Trial of conservative care so that the patient can continue work If fails to improve we need to be more aggressive

## 2022-07-07 NOTE — Progress Notes (Signed)
  SUBJECTIVE:   CHIEF COMPLAINT / HPI:   56 year old male [employer] worker presenting with 1 to 2 months of right lateral foot pain which comes and goes.  Denies injury but states he walks about 20,000 steps a day for his job.  Uses 3-6 tabs of Advil per day when needed.  Denies any appreciable swelling or bruising.  He had been using ascaphoid pad in his work shoe and is unsure if that is what may have caused more pressure on the lateral part of his foot. Custom orthotics feel good but not able to use in his work shoe.  PERTINENT  PMH / PSH: plantar fasciitis  OBJECTIVE:  BP 132/74   Ht '5\' 11"'$  (1.803 m)   Wt 220 lb (99.8 kg)   BMI 30.68 kg/m  Right foot: no gross deformity, swelling, ecchymoses. 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 Fields, MD   ASSESSMENT/PLAN:  Avulsion fracture of metatarsal bone with delayed healing, left Assessment & Plan: Avulsion fracture appears to be from peroneus tertius insertion onto head of 5th metatarsal. Due to significant mileage during work.  -Body helix compression sleeve -Shoe padding -Ice everyday, limit walking as able -Return if not improving  Orders: -     [place] LIMITED JOINT SPACE STRUCTURES LOW RIGHT; Future  Wells Guiles, DO 07/07/2022, 5:29 PM PGY-2, [place]  I observed and examined the patient with the resident and agree with assessment and plan.  Note reviewed and modified by  me. This is not optimal treatment but patient wants to try to take a conservative approach and see if this will heal.  If we have to use a boot or limit his walking he will need to be OOW.  Ila Mcgill, MD [facility]

## 2022-08-11 ENCOUNTER — Ambulatory Visit: Payer: BC Managed Care – PPO | Admitting: Sports Medicine

## 2022-08-18 ENCOUNTER — Ambulatory Visit: Payer: BC Managed Care – PPO | Admitting: Sports Medicine

## 2022-08-18 VITALS — BP 118/74 | Ht 71.0 in | Wt 220.0 lb

## 2022-08-18 DIAGNOSIS — S92354A Nondisplaced fracture of fifth metatarsal bone, right foot, initial encounter for closed fracture: Secondary | ICD-10-CM

## 2022-08-18 NOTE — Progress Notes (Signed)
Chief complaint follow-up of right foot fracture  Patient works for [employer] and has been having a lot of foot pain.  On last visit we found a small avulsion fracture from the dorsum of his fifth metatarsal base He did not feel that he could fully rest this because of his work He has worn supportive shoes with cushioned insoles He comes back today for recheck but says that he is feeling better He still has some tenderness occasionally at the base of the fifth metatarsal He actually has more tenderness under his first MTP joint  He states that he walks about 20,000 steps a day working for [employer]  Physical exam Pleasant white male in no acute distress BP 118/74   Ht '5\' 11"'$  (1.803 m)   Wt 220 lb (99.8 kg)   BMI 30.68 kg/m   There is no swelling over the right fifth metatarsal No redness is observed He is essentially nontender to palpation percussion Movement of the cuboid does not bring out any pain There is no swelling of his first MTP joint There may be some slight pain just proximal to the MTP joint Foot shape reveals that he has a preserved longitudinal arch Transverse arch slightly flattened but not abnormal Walking gait reveals that he strikes in mild supination

## 2022-08-18 NOTE — Assessment & Plan Note (Signed)
The patient is doing better using good foot support I took a new insole that he brought and padded the fifth metatarsal area I put a cut out dancers type pad for the first MTP joint He will try these things and see if they help Otherwise he will return to normal activities and supportive shoes He uses his orthotics and shoes in which they fit well  Return as needed

## 2023-04-11 ENCOUNTER — Telehealth: Payer: Self-pay | Admitting: Family Medicine

## 2023-04-11 DIAGNOSIS — Z0000 Encounter for general adult medical examination without abnormal findings: Secondary | ICD-10-CM

## 2023-04-11 NOTE — Telephone Encounter (Signed)
Requesting to have labs drawn prior to appointment. Says she has to be at work around 830a and has a 40 minute drive. Appt scheduled for 10/2 at 7am

## 2023-04-12 NOTE — Telephone Encounter (Signed)
Unable to leave a message due to request noted on the DPR.  Lab orders entered as below.

## 2023-04-12 NOTE — Addendum Note (Signed)
Addended by: Johnella Moloney on: 04/12/2023 09:27 AM   Modules accepted: Orders

## 2023-06-07 ENCOUNTER — Encounter: Payer: BC Managed Care – PPO | Admitting: Family Medicine

## 2023-06-21 ENCOUNTER — Ambulatory Visit: Payer: BC Managed Care – PPO | Admitting: Family Medicine

## 2023-06-21 ENCOUNTER — Encounter: Payer: Self-pay | Admitting: Family Medicine

## 2023-06-21 VITALS — BP 118/78 | HR 64 | Temp 97.7°F | Ht 71.65 in | Wt 236.9 lb

## 2023-06-21 DIAGNOSIS — Z0000 Encounter for general adult medical examination without abnormal findings: Secondary | ICD-10-CM

## 2023-06-21 DIAGNOSIS — R5383 Other fatigue: Secondary | ICD-10-CM

## 2023-06-21 MED ORDER — MOMETASONE FUROATE 0.1 % EX SOLN: Freq: Every day | CUTANEOUS | 0 refills | Status: AC

## 2023-06-21 MED ORDER — TADALAFIL 20 MG PO TABS: 20.0000 mg | ORAL_TABLET | Freq: Every day | ORAL | 11 refills | Status: DC | PRN

## 2023-06-21 NOTE — Progress Notes (Signed)
Established Patient Office Visit  Subjective   Patient ID: Martin Bennett., male    DOB: [birth year]  Age: 57 y.o. MRN: [id number]  Chief Complaint  Patient presents with   Annual Exam    HPI   Shelva Majestic is seen for physical exam.  Will be expecting their first grandchild in about 2 weeks.  This is his daughter who lives in [place].  He is generally doing well.  Has some fatigue when he wakes up early in the mornings.  Would like to get testosterone checked.  Also has had some dry scaly rash in his ears. Previous Viagra use but has had some adverse side effects.  Would like to consider other options  Health maintenance reviewed:  Health Maintenance  Topic Date Due   HIV Screening  Never done   Zoster Vaccines- Shingrix (1 of 2) Never done   COVID-19 Vaccine (1 - 2023-24 season) Never done   Colonoscopy  10/19/2027   DTaP/Tdap/Td (3 - Td or Tdap) 10/30/2028   Hepatitis C Screening  Completed   HPV VACCINES  Aged Out   INFLUENZA VACCINE  Discontinued   -Declines flu vaccine -Colonoscopy due 2029 as above   Family history-mother with history of hypertension and breast cancer.  His father just retired age 84 and sounds to be in remarkably good health overall.  His father just retired a year ago.   Social history: Married with 2 children.  He has 1 daughter who is living in [place].  The other attends a college down in [place] and plays collegiate soccer.  Patient is non-smoker.  Works for [employer].  Expecting first grandchild October 29.  This will be a grandson.  Past Medical History:  Diagnosis Date   Allergy    Arthritis    osetoarthritis    Headache(784.0)    Hemorrhoids    Macular degeneration    beginnings 10-12-17   Tubular adenoma of colon    Past Surgical History:  Procedure Laterality Date   bone graft L hand  1992   COLONOSCOPY     KNEE ARTHROSCOPY  2012   L knee   KNEE ARTHROSCOPY  2013   rt knee   PARTIAL KNEE ARTHROPLASTY      POLYPECTOMY     TONSILLECTOMY  1974    reports that he has never smoked. He has never used smokeless tobacco. He reports current alcohol use of about 1.0 standard drink of alcohol per week. He reports that he does not use drugs. family history includes Arthritis in his father and another family member; Breast cancer in his mother; Hypertension in his mother. Allergies  Allergen Reactions   Penicillins      Review of Systems  Constitutional:  Positive for malaise/fatigue. Negative for chills, fever and weight loss.  HENT:  Negative for hearing loss.   Eyes:  Negative for blurred vision and double vision.  Respiratory:  Negative for cough and shortness of breath.   Cardiovascular:  Negative for chest pain, palpitations and leg swelling.  Gastrointestinal:  Negative for abdominal pain, blood in stool, constipation and diarrhea.  Genitourinary:  Negative for dysuria.  Skin:  Negative for rash.  Neurological:  Negative for dizziness, speech change, seizures, loss of consciousness and headaches.  Psychiatric/Behavioral:  Negative for depression.       Objective:     BP 118/78 (BP Location: Left Arm, Patient Position: Sitting, Cuff Size: Normal)   Pulse 64   Temp 97.7 F (36.5 C) (Oral)  Ht 5' 11.65" (1.82 m)   Wt 236 lb 14.4 oz (107.5 kg)   SpO2 100%   BMI 32.44 kg/m  BP Readings from Last 3 Encounters:  06/21/23 118/78  08/18/22 118/74  07/07/22 132/74   Wt Readings from Last 3 Encounters:  06/21/23 236 lb 14.4 oz (107.5 kg)  08/18/22 220 lb (99.8 kg)  07/07/22 220 lb (99.8 kg)      Physical Exam Vitals reviewed.  Constitutional:      General: He is not in acute distress.    Appearance: He is well-developed.  HENT:     Head: Normocephalic and atraumatic.     Ears:     Comments: Some nonspecific dryness and flakiness both ear canals.  Eardrums appear normal.  No cerumen. Eyes:     Conjunctiva/sclera: Conjunctivae normal.     Pupils: Pupils are equal, round, and  reactive to light.  Neck:     Thyroid: No thyromegaly.  Cardiovascular:     Rate and Rhythm: Normal rate and regular rhythm.     Heart sounds: Normal heart sounds. No murmur heard. Pulmonary:     Effort: No respiratory distress.     Breath sounds: No wheezing or rales.  Abdominal:     General: Bowel sounds are normal. There is no distension.     Palpations: Abdomen is soft. There is no mass.     Tenderness: There is no abdominal tenderness. There is no guarding or rebound.  Musculoskeletal:     Cervical back: Normal range of motion and neck supple.     Right lower leg: No edema.     Left lower leg: No edema.  Lymphadenopathy:     Cervical: No cervical adenopathy.  Skin:    Findings: No rash.  Neurological:     Mental Status: He is alert and oriented to person, place, and time.     Cranial Nerves: No cranial nerve deficit.     No results found for any visits on 06/21/23.    The 10-year ASCVD risk score (Arnett DK, et al., 2019) is: 5.3%    Assessment & Plan:   Problem List Items Addressed This Visit   None Visit Diagnoses     Physical exam    -  Primary   Relevant Orders   Basic metabolic panel   Lipid panel   CBC with Differential/Platelet   Hepatic function panel   PSA   Fatigue, unspecified type       Relevant Orders   Testosterone     -Has some eczematous changes both ear canals.  Recommend trial of mometasone lotion to use couple drops each ear once daily as needed  -Relates nonspecific fatigue.  Does not have significant daytime somnolence.  No observed apnea episodes.  We discussed checking early morning testosterone level  -Try switching from Viagra to Cialis.  He had some side effects with Viagra and we will see if this is minimized with Cialis  -Flu vaccine offered but declined.  -We have encouraged him to try to lose some weight.  No follow-ups on file.    Evelena Peat, MD

## 2023-06-22 LAB — CBC WITH DIFFERENTIAL/PLATELET
Absolute Lymphocytes: 1124 {cells}/uL (ref 850–3900)
Absolute Monocytes: 541 {cells}/uL (ref 200–950)
Basophils Absolute: 48 {cells}/uL (ref 0–200)
Basophils Relative: 0.9 %
Eosinophils Absolute: 260 {cells}/uL (ref 15–500)
Eosinophils Relative: 4.9 %
HCT: 49.3 % (ref 38.5–50.0)
Hemoglobin: 16 g/dL (ref 13.2–17.1)
MCH: 29.2 pg (ref 27.0–33.0)
MCHC: 32.5 g/dL (ref 32.0–36.0)
MCV: 90 fL (ref 80.0–100.0)
MPV: 11 fL (ref 7.5–12.5)
Monocytes Relative: 10.2 %
Neutro Abs: 3328 {cells}/uL (ref 1500–7800)
Neutrophils Relative %: 62.8 %
Platelets: 207 10*3/uL (ref 140–400)
RBC: 5.48 10*6/uL (ref 4.20–5.80)
RDW: 12.3 % (ref 11.0–15.0)
Total Lymphocyte: 21.2 %
WBC: 5.3 10*3/uL (ref 3.8–10.8)

## 2023-06-22 LAB — HEPATIC FUNCTION PANEL
AG Ratio: 1.8 (calc) (ref 1.0–2.5)
ALT: 18 U/L (ref 9–46)
AST: 17 U/L (ref 10–35)
Albumin: 4.2 g/dL (ref 3.6–5.1)
Alkaline phosphatase (APISO): 98 U/L (ref 35–144)
Bilirubin, Direct: 0.1 mg/dL (ref 0.0–0.2)
Globulin: 2.4 g/dL (ref 1.9–3.7)
Indirect Bilirubin: 0.3 mg/dL (ref 0.2–1.2)
Total Bilirubin: 0.4 mg/dL (ref 0.2–1.2)
Total Protein: 6.6 g/dL (ref 6.1–8.1)

## 2023-06-22 LAB — LIPID PANEL
Cholesterol: 158 mg/dL (ref <200)
HDL: 43 mg/dL (ref >=40)
LDL Cholesterol (Calc): 94 mg/dL
Non-HDL Cholesterol (Calc): 115 mg/dL (ref <130)
Total CHOL/HDL Ratio: 3.7 (calc) (ref <5.0)
Triglycerides: 112 mg/dL (ref <150)

## 2023-06-22 LAB — BASIC METABOLIC PANEL
BUN: 17 mg/dL (ref 7–25)
CO2: 24 mmol/L (ref 20–32)
Calcium: 8.6 mg/dL (ref 8.6–10.3)
Chloride: 107 mmol/L (ref 98–110)
Creat: 1.11 mg/dL (ref 0.70–1.30)
Glucose, Bld: 96 mg/dL (ref 65–99)
Potassium: 4.3 mmol/L (ref 3.5–5.3)
Sodium: 140 mmol/L (ref 135–146)

## 2023-06-22 LAB — TESTOSTERONE: Testosterone: 451 ng/dL (ref 250–827)

## 2023-06-22 LAB — PSA: PSA: 0.62 ng/mL (ref <=4.00)

## 2024-06-04 ENCOUNTER — Telehealth: Payer: Self-pay | Admitting: *Deleted

## 2024-06-04 DIAGNOSIS — Z0000 Encounter for general adult medical examination without abnormal findings: Secondary | ICD-10-CM

## 2024-06-04 NOTE — Telephone Encounter (Signed)
 Copied from CRM [id number]. Topic: Clinical - Request for Lab/Test Order >> Jun 04, 2024  1:33 PM Anairis L wrote: Reason for CRM: Patient is calling request physical blood work order to be placed and if someone can call him and schedule. He does not have a active Mychart.  Thank you.

## 2024-06-05 NOTE — Telephone Encounter (Signed)
Labs placed and appt scheduled  

## 2024-06-18 ENCOUNTER — Other Ambulatory Visit

## 2024-06-18 ENCOUNTER — Other Ambulatory Visit: Payer: Self-pay

## 2024-06-18 DIAGNOSIS — Z0000 Encounter for general adult medical examination without abnormal findings: Secondary | ICD-10-CM | POA: Diagnosis not present

## 2024-06-19 LAB — LIPID PANEL
Cholesterol: 178 mg/dL (ref <200)
HDL: 49 mg/dL (ref >=40)
LDL Cholesterol (Calc): 103 mg/dL — ABNORMAL HIGH
Non-HDL Cholesterol (Calc): 129 mg/dL (ref <130)
Total CHOL/HDL Ratio: 3.6 (calc) (ref <5.0)
Triglycerides: 166 mg/dL — ABNORMAL HIGH (ref <150)

## 2024-06-19 LAB — COMPREHENSIVE METABOLIC PANEL WITH GFR
AG Ratio: 2.2 (calc) (ref 1.0–2.5)
ALT: 16 U/L (ref 9–46)
AST: 18 U/L (ref 10–35)
Albumin: 4.7 g/dL (ref 3.6–5.1)
Alkaline phosphatase (APISO): 76 U/L (ref 35–144)
BUN: 13 mg/dL (ref 7–25)
CO2: 25 mmol/L (ref 20–32)
Calcium: 9.3 mg/dL (ref 8.6–10.3)
Chloride: 105 mmol/L (ref 98–110)
Creat: 1.15 mg/dL (ref 0.70–1.30)
Globulin: 2.1 g/dL (ref 1.9–3.7)
Glucose, Bld: 93 mg/dL (ref 65–99)
Potassium: 4.1 mmol/L (ref 3.5–5.3)
Sodium: 139 mmol/L (ref 135–146)
Total Bilirubin: 0.7 mg/dL (ref 0.2–1.2)
Total Protein: 6.8 g/dL (ref 6.1–8.1)
eGFR: 74 mL/min/1.73m2 (ref >=60)

## 2024-06-19 LAB — CBC WITH DIFFERENTIAL/PLATELET
Absolute Lymphocytes: 1245 {cells}/uL (ref 850–3900)
Absolute Monocytes: 485 {cells}/uL (ref 200–950)
Basophils Absolute: 50 {cells}/uL (ref 0–200)
Basophils Relative: 1 %
Eosinophils Absolute: 210 {cells}/uL (ref 15–500)
Eosinophils Relative: 4.2 %
HCT: 50.8 % — ABNORMAL HIGH (ref 38.5–50.0)
Hemoglobin: 16.8 g/dL (ref 13.2–17.1)
MCH: 30.2 pg (ref 27.0–33.0)
MCHC: 33.1 g/dL (ref 32.0–36.0)
MCV: 91.4 fL (ref 80.0–100.0)
MPV: 11.2 fL (ref 7.5–12.5)
Monocytes Relative: 9.7 %
Neutro Abs: 3010 {cells}/uL (ref 1500–7800)
Neutrophils Relative %: 60.2 %
Platelets: 226 Thousand/uL (ref 140–400)
RBC: 5.56 Million/uL (ref 4.20–5.80)
RDW: 12.7 % (ref 11.0–15.0)
Total Lymphocyte: 24.9 %
WBC: 5 Thousand/uL (ref 3.8–10.8)

## 2024-06-19 LAB — PSA: PSA: 0.54 ng/mL (ref <=4.00)

## 2024-06-21 ENCOUNTER — Ambulatory Visit (INDEPENDENT_AMBULATORY_CARE_PROVIDER_SITE_OTHER): Admitting: Family Medicine

## 2024-06-21 ENCOUNTER — Encounter: Payer: Self-pay | Admitting: Family Medicine

## 2024-06-21 VITALS — BP 110/60 | HR 69 | Temp 98.4°F | Ht 71.65 in | Wt 231.2 lb

## 2024-06-21 DIAGNOSIS — Z0000 Encounter for general adult medical examination without abnormal findings: Secondary | ICD-10-CM

## 2024-06-21 MED ORDER — TADALAFIL 20 MG PO TABS: 10.0000 mg | ORAL_TABLET | ORAL | 11 refills | Status: AC | PRN

## 2024-06-21 NOTE — Progress Notes (Signed)
 Established Patient Office Visit  Subjective   Patient ID: Martin Bennett., male    DOB: 07/03/1966  Age: 58 y.o. MRN: [id number]  Chief Complaint  Patient presents with   Annual Exam    HPI   Martin Bennett is seen today for physical exam.  Generally doing well.  He had progressive knee arthritis and is looking at probable left total knee replacement at some point within the next year.  He continues to work full-time for [employer].  Usually gets 20,000 steps per day.  No formal exercise.  Having some recent issues with muscle cramps at night especially lower extremities.  He thinks he is taking magnesium supplement.  Occasionally takes turmeric.  May not be getting adequate fluids.  Only medication is Cialis  as needed.  No prescription medications.  He does continue to see dermatologist yearly for skin cancer screening  Health maintenance reviewed:  Health Maintenance  Topic Date Due   HIV Screening  Never done   Hepatitis B Vaccines 19-59 Average Risk (1 of 3 - 19+ 3-dose series) Never done   COVID-19 Vaccine (1 - 2025-26 season) Never done   Zoster Vaccines- Shingrix (1 of 2) 09/21/2024 (Originally 12/23/2015)   Pneumococcal Vaccine: 50+ Years (1 of 1 - PCV) 06/21/2025 (Originally 12/23/2015)   Colonoscopy  10/19/2027   DTaP/Tdap/Td (3 - Td or Tdap) 10/30/2028   Hepatitis C Screening  Completed   HPV VACCINES  Aged Out   Meningococcal B Vaccine  Aged Out   Influenza Vaccine  Discontinued   - He declines vaccines including influenza, pneumonia, Shingrix -Colonoscopy up-to-date  Social history-married.  2 children.  Has 1 grandson almost 30-year-old.  They live in [place].  Works for [employer].  Non-smoker.  Family history-mom has history of hypertension and breast cancer.  She has been battling with some anemia this year and they have not found exact etiology yet.  Father is age 20 and generally in good health.  No known family history of premature CAD.  Past Medical History:  Diagnosis  Date   Allergy    Arthritis    osetoarthritis    Headache(784.0)    Hemorrhoids    Macular degeneration    beginnings 10-12-17   Tubular adenoma of colon    Past Surgical History:  Procedure Laterality Date   bone graft L hand  1992   COLONOSCOPY     KNEE ARTHROSCOPY  2012   L knee   KNEE ARTHROSCOPY  2013   rt knee   PARTIAL KNEE ARTHROPLASTY     POLYPECTOMY     TONSILLECTOMY  1974    reports that he has never smoked. He has never used smokeless tobacco. He reports current alcohol use of about 1.0 standard drink of alcohol per week. He reports that he does not use drugs. family history includes Arthritis in his father and another family member; Breast cancer in his mother; Hypertension in his mother. Allergies  Allergen Reactions   Penicillins     Review of Systems  Constitutional:  Negative for chills, fever, malaise/fatigue and weight loss.  HENT:  Negative for hearing loss.   Eyes:  Negative for blurred vision and double vision.  Respiratory:  Negative for cough and shortness of breath.   Cardiovascular:  Negative for chest pain, palpitations and leg swelling.  Gastrointestinal:  Negative for abdominal pain, blood in stool, constipation and diarrhea.  Genitourinary:  Negative for dysuria.  Musculoskeletal:  Positive for joint pain.  Skin:  Negative for rash.  Neurological:  Negative for dizziness, speech change, seizures, loss of consciousness and headaches.  Psychiatric/Behavioral:  Negative for depression.       Objective:     BP 110/60   Pulse 69   Temp 98.4 F (36.9 C) (Oral)   Ht 5' 11.65 (1.82 m)   Wt 231 lb 3.2 oz (104.9 kg)   SpO2 99%   BMI 31.66 kg/m  BP Readings from Last 3 Encounters:  06/21/24 110/60  06/21/23 118/78  08/18/22 118/74   Wt Readings from Last 3 Encounters:  06/21/24 231 lb 3.2 oz (104.9 kg)  06/21/23 236 lb 14.4 oz (107.5 kg)  08/18/22 220 lb (99.8 kg)      Physical Exam Vitals reviewed.  Constitutional:       General: He is not in acute distress.    Appearance: He is well-developed. He is not ill-appearing.  HENT:     Head: Normocephalic and atraumatic.     Right Ear: External ear normal.     Left Ear: External ear normal.     Mouth/Throat:     Mouth: Mucous membranes are moist.     Pharynx: Oropharynx is clear.  Eyes:     Conjunctiva/sclera: Conjunctivae normal.     Pupils: Pupils are equal, round, and reactive to light.  Neck:     Thyroid: No thyromegaly.  Cardiovascular:     Rate and Rhythm: Normal rate and regular rhythm.     Heart sounds: Normal heart sounds. No murmur heard. Pulmonary:     Effort: No respiratory distress.     Breath sounds: No wheezing or rales.  Abdominal:     General: Bowel sounds are normal. There is no distension.     Palpations: Abdomen is soft. There is no mass.     Tenderness: There is no abdominal tenderness. There is no guarding or rebound.  Musculoskeletal:     Cervical back: Normal range of motion and neck supple.     Right lower leg: No edema.     Left lower leg: No edema.  Lymphadenopathy:     Cervical: No cervical adenopathy.  Skin:    Findings: No rash.  Neurological:     Mental Status: He is alert and oriented to person, place, and time.     Cranial Nerves: No cranial nerve deficit.      No results found for any visits on 06/21/24.  Last CBC Lab Results  Component Value Date   WBC 5.0 06/18/2024   HGB 16.8 06/18/2024   HCT 50.8 (H) 06/18/2024   MCV 91.4 06/18/2024   MCH 30.2 06/18/2024   RDW 12.7 06/18/2024   PLT 226 06/18/2024   Last metabolic panel Lab Results  Component Value Date   GLUCOSE 93 06/18/2024   NA 139 06/18/2024   K 4.1 06/18/2024   CL 105 06/18/2024   CO2 25 06/18/2024   BUN 13 06/18/2024   CREATININE 1.15 06/18/2024   EGFR 74 06/18/2024   CALCIUM 9.3 06/18/2024   PROT 6.8 06/18/2024   ALBUMIN 4.4 02/05/2020   BILITOT 0.7 06/18/2024   ALKPHOS 78 02/05/2020   AST 18 06/18/2024   ALT 16 06/18/2024    Last lipids Lab Results  Component Value Date   CHOL 178 06/18/2024   HDL 49 06/18/2024   LDLCALC 103 (H) 06/18/2024   LDLDIRECT 101.0 02/05/2020   TRIG 166 (H) 06/18/2024   CHOLHDL 3.6 06/18/2024      The 10-year ASCVD risk score (Arnett DK, et al., 2019)  is: 5.1%    Assessment & Plan:   Annual physical exam/wellness visit.  We discussed vaccinations including influenza, pneumonia, Shingrix and he declines  -Reviewed recent labs.  No major concerns. - Discussed American Heart Association recommendations for minimum 150 minutes/week of moderate intensity exercise.  He is limited because of his osteoarthritis with walking we discussed potential alternatives  Wolm Scarlet, MD

## 2024-07-09 ENCOUNTER — Ambulatory Visit: Admitting: Sports Medicine

## 2024-07-09 VITALS — Ht 71.65 in | Wt 231.0 lb

## 2024-07-09 DIAGNOSIS — M79671 Pain in right foot: Secondary | ICD-10-CM

## 2024-07-09 DIAGNOSIS — R269 Unspecified abnormalities of gait and mobility: Secondary | ICD-10-CM

## 2024-07-09 DIAGNOSIS — M1712 Unilateral primary osteoarthritis, left knee: Secondary | ICD-10-CM

## 2024-07-09 DIAGNOSIS — Z96651 Presence of right artificial knee joint: Secondary | ICD-10-CM

## 2024-07-09 DIAGNOSIS — M2170 Unequal limb length (acquired), unspecified site: Secondary | ICD-10-CM

## 2024-07-09 DIAGNOSIS — M79672 Pain in left foot: Secondary | ICD-10-CM

## 2024-07-09 NOTE — Progress Notes (Unsigned)
 [facility] A Department of [facility]   PCP: Micheal Wolm ORN, MD  CHIEF COMPLAINT: Bilateral foot pain  HPI: Patient is a pleasant 58 y.o. male who presents today for fitting of custom orthotics.  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  Patient says initial pair of orthotics have significantly improved foot pain and also assisted with improving knee walking mechanics.  Patient has a history of partial knee replacement on his right side and plans to have total knee replacement of his left side coming up in April 2026.  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  He walks with a limp.  Here today requesting additional pair of orthotics be made to improve leg length discrepancy, improved limp, and improved foot pain.  Patient endorses plantar fascia pain worse on left side than right side.  PMH:  Past Medical History:  Diagnosis Date   Allergy    Arthritis    osetoarthritis    Headache(784.0)    Hemorrhoids    Macular degeneration    beginnings 10-12-17   Tubular adenoma of colon     Patient Active Problem List   Diagnosis Date Noted   Nondisplaced fracture of fifth metatarsal bone, right foot, initial encounter for closed fracture 07/07/2022   Abnormality of gait 12/16/2021   Nasal obstruction 05/29/2020   S/P right unicompartmental knee replacement 04/07/2020   Adenomatous polyp 05/10/2017   Degenerative joint disease of knee, left 05/21/2013   Left anterior knee pain 05/21/2013   INGUINAL HERNIA 01/25/2010   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   CYANOCOBALAMIN 500 MCG TABLET    Take 2,000 mcg by mouth daily.    FEXOFENADINE HCL (ALLEGRA PO)    Take by mouth.   IBUPROFEN  (ADVIL ,MOTRIN ) 200 MG TABLET    Take by mouth as needed.    MOMETASONE  (ELOCON ) 0.1 % LOTION    Apply topically daily.   NON FORMULARY    Vit d 5,000   PROBIOTIC PRODUCT (PROBIOTIC PO)    Take by mouth.   SAW PALMETTO 450 MG CAPS    Take 1 capsule by mouth daily.   TADALAFIL  (CIALIS ) 20 MG TABLET    Take 20 mg by mouth every other day.   TADALAFIL  (CIALIS ) 20 MG TABLET    Take 0.5-1 tablets (10-20 mg total) by mouth every other day as needed for erectile dysfunction.   ZINC GLUCONATE 50 MG TABLET    Take 50 mg by mouth daily.    Social:  Social History   Tobacco Use   Smoking status: Never   Smokeless tobacco: Never  Substance Use Topics  Alcohol use: Yes    Alcohol/week: 1.0 standard drink of alcohol    Types: 1 Cans of beer per week    Comment: occ    REVIEW OF SYSTEMS:  ROS negative except as noted in HPI above   Objective Exam:  Vitals:   07/09/24 1007  Weight: 231 lb (104.8 kg)  Height: 5' 11.65 (1.82 m)    GENERAL: Patient is afebrile, Vital signs reviewed, well appearing, Patient appears comfortable, Alert and lucid. No apparent distress.   Physical Exam   Ortho Exam:  RESULTS:  Labs: No results found for this or any previous visit (from the past 48 hours).  Imaging:  No orders to display    Assessment/Plan:  No diagnosis found. ***  New Prescriptions   No medications on file    Medications, medical history, allergies, surgical history, hospitalizations, family history, social history, ROS and vitals entered by nursing staff and  reviewed by myself.  I discussed with the patient the diagnosis, treatment plan, indications for return to the emergency department, and for expected follow-up. The patient verbalized an understanding. The patient is asked if there are any questions or concerns. We discuss the case, until all issues are addressed to the patient's satisfaction.  Follow up per instructions including returning for additional office visit if symptoms worsen or proceeding to the emergency department or urgent care in the next 12-24hrs if there is an acute concerning increasing symptoms, pain, fevers, or other symptoms.  Prentice Agent, DO  10:58 AM, 07/09/2024

## 2024-07-10 ENCOUNTER — Encounter: Payer: Self-pay | Admitting: Sports Medicine

## 2024-09-12 ENCOUNTER — Telehealth: Payer: Self-pay | Admitting: Family Medicine

## 2024-09-12 NOTE — Telephone Encounter (Signed)
 Preoperative Clearance Form to be filled out- placed in provider's folder.  Please fax to [phone] upon completion.    *Please give patient a call once form is faxed just to let the patient know it has been completed.

## 2024-09-12 NOTE — Telephone Encounter (Signed)
Forms faxed and patient aware
